# Patient Record
Sex: Female | Born: 1970 | Race: White | Hispanic: No | Marital: Married | Smoking: Former smoker
Health system: Southern US, Community
[De-identification: ages and names within clinical notes are randomized; demographics above are authoritative.]

## PROBLEM LIST (undated history)

## (undated) DIAGNOSIS — F329 Major depressive disorder, single episode, unspecified: Secondary | ICD-10-CM

## (undated) DIAGNOSIS — E039 Hypothyroidism, unspecified: Secondary | ICD-10-CM

## (undated) DIAGNOSIS — E079 Disorder of thyroid, unspecified: Secondary | ICD-10-CM

## (undated) DIAGNOSIS — B019 Varicella without complication: Secondary | ICD-10-CM

## (undated) DIAGNOSIS — E538 Deficiency of other specified B group vitamins: Secondary | ICD-10-CM

## (undated) DIAGNOSIS — F32A Depression, unspecified: Secondary | ICD-10-CM

## (undated) DIAGNOSIS — E559 Vitamin D deficiency, unspecified: Secondary | ICD-10-CM

## (undated) DIAGNOSIS — K219 Gastro-esophageal reflux disease without esophagitis: Secondary | ICD-10-CM

## (undated) DIAGNOSIS — R609 Edema, unspecified: Secondary | ICD-10-CM

## (undated) DIAGNOSIS — F419 Anxiety disorder, unspecified: Secondary | ICD-10-CM

## (undated) DIAGNOSIS — Z9889 Other specified postprocedural states: Secondary | ICD-10-CM

## (undated) DIAGNOSIS — M549 Dorsalgia, unspecified: Secondary | ICD-10-CM

## (undated) DIAGNOSIS — R112 Nausea with vomiting, unspecified: Secondary | ICD-10-CM

## (undated) HISTORY — DX: Disorder of thyroid, unspecified: E07.9

## (undated) HISTORY — DX: Deficiency of other specified B group vitamins: E53.8

## (undated) HISTORY — DX: Anxiety disorder, unspecified: F41.9

## (undated) HISTORY — DX: Nausea with vomiting, unspecified: R11.2

## (undated) HISTORY — DX: Depression, unspecified: F32.A

## (undated) HISTORY — PX: OTHER SURGICAL HISTORY: SHX169

## (undated) HISTORY — DX: Other specified postprocedural states: Z98.890

## (undated) HISTORY — DX: Vitamin D deficiency, unspecified: E55.9

## (undated) HISTORY — DX: Hypothyroidism, unspecified: E03.9

## (undated) HISTORY — DX: Major depressive disorder, single episode, unspecified: F32.9

## (undated) HISTORY — DX: Varicella without complication: B01.9

## (undated) HISTORY — DX: Dorsalgia, unspecified: M54.9

## (undated) HISTORY — DX: Gastro-esophageal reflux disease without esophagitis: K21.9

## (undated) HISTORY — DX: Edema, unspecified: R60.9

---
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
## 1997-09-06 HISTORY — PX: OTHER SURGICAL HISTORY: SHX169

## 2003-10-02 ENCOUNTER — Other Ambulatory Visit: Admission: RE | Admit: 2003-10-02 | Discharge: 2003-10-02 | Payer: Self-pay | Admitting: Obstetrics and Gynecology

## 2004-02-10 ENCOUNTER — Other Ambulatory Visit: Admission: RE | Admit: 2004-02-10 | Discharge: 2004-02-10 | Payer: Self-pay | Admitting: Obstetrics and Gynecology

## 2004-07-01 ENCOUNTER — Other Ambulatory Visit: Admission: RE | Admit: 2004-07-01 | Discharge: 2004-07-01 | Payer: Self-pay | Admitting: Obstetrics and Gynecology

## 2004-10-12 ENCOUNTER — Other Ambulatory Visit: Admission: RE | Admit: 2004-10-12 | Discharge: 2004-10-12 | Payer: Self-pay | Admitting: Obstetrics and Gynecology

## 2005-05-14 ENCOUNTER — Other Ambulatory Visit: Admission: RE | Admit: 2005-05-14 | Discharge: 2005-05-14 | Payer: Self-pay | Admitting: Obstetrics and Gynecology

## 2005-12-16 ENCOUNTER — Other Ambulatory Visit: Admission: RE | Admit: 2005-12-16 | Discharge: 2005-12-16 | Payer: Self-pay | Admitting: Obstetrics and Gynecology

## 2006-09-28 ENCOUNTER — Ambulatory Visit (HOSPITAL_COMMUNITY): Admission: RE | Admit: 2006-09-28 | Discharge: 2006-09-28 | Payer: Self-pay | Admitting: Obstetrics and Gynecology

## 2006-11-28 ENCOUNTER — Inpatient Hospital Stay (HOSPITAL_COMMUNITY): Admission: AD | Admit: 2006-11-28 | Discharge: 2006-11-28 | Payer: Self-pay | Admitting: Obstetrics and Gynecology

## 2006-11-29 ENCOUNTER — Inpatient Hospital Stay (HOSPITAL_COMMUNITY): Admission: AD | Admit: 2006-11-29 | Discharge: 2006-12-02 | Payer: Self-pay | Admitting: Obstetrics & Gynecology

## 2013-01-23 ENCOUNTER — Other Ambulatory Visit: Payer: Self-pay | Admitting: Obstetrics and Gynecology

## 2013-01-23 DIAGNOSIS — R928 Other abnormal and inconclusive findings on diagnostic imaging of breast: Secondary | ICD-10-CM

## 2013-02-02 ENCOUNTER — Ambulatory Visit
Admission: RE | Admit: 2013-02-02 | Discharge: 2013-02-02 | Disposition: A | Discharge status: Home / Self Care | Payer: 59 | Source: Ambulatory Visit | Attending: Obstetrics and Gynecology | Admitting: Obstetrics and Gynecology

## 2013-02-02 DIAGNOSIS — R928 Other abnormal and inconclusive findings on diagnostic imaging of breast: Secondary | ICD-10-CM

## 2015-09-29 ENCOUNTER — Ambulatory Visit (INDEPENDENT_AMBULATORY_CARE_PROVIDER_SITE_OTHER): Payer: 59

## 2015-09-29 ENCOUNTER — Ambulatory Visit (INDEPENDENT_AMBULATORY_CARE_PROVIDER_SITE_OTHER): Payer: 59 | Admitting: Physician Assistant

## 2015-09-29 VITALS — BP 124/86 | HR 79 | Temp 98.9°F | Resp 16 | Ht 65.0 in | Wt 179.0 lb

## 2015-09-29 DIAGNOSIS — S60221A Contusion of right hand, initial encounter: Secondary | ICD-10-CM | POA: Diagnosis not present

## 2015-09-29 DIAGNOSIS — S6991XA Unspecified injury of right wrist, hand and finger(s), initial encounter: Secondary | ICD-10-CM

## 2015-09-29 NOTE — Patient Instructions (Addendum)
Because you received an x-ray today, you will receive an invoice from [facility]. Please contact [facility] at [phone] with questions or concerns regarding your invoice. Our billing staff will not be able to assist you with those questions.   Rest, ice, compression, elevation. Ibuprofen 400-600 mg three times a day. If your symptoms are not improving in 1 week, return.   RICE for Routine Care of Injuries Theroutine careofmanyinjuriesincludes rest, ice, compression, and elevation (RICE therapy). RICE therapy is often recommended for injuries to soft tissues, such as a muscle strain, ligament injuries, bruises, and overuse injuries. It can also be used for some bony injuries. Using RICE therapy can help to relieve pain, lessen swelling, and enable your body to heal. Rest Rest is required to allow your body to heal. This usually involves reducing your normal activities and avoiding use of the injured part of your body. Generally, you can return to your normal activities when you are comfortable and have been given permission by your health care provider. Ice Icing your injury helps to keep the swelling down, and it lessens pain. Do not apply ice directly to your skin.  Put ice in a plastic bag.  Place a towel between your skin and the bag.  Leave the ice on for 20 minutes, 2-3 times a day. Do this for as long as you are directed by your health care provider. Compression Compression means putting pressure on the injured area. Compression helps to keep swelling down, gives support, and helps with discomfort. Compression may be done with an elastic bandage. If an elastic bandage has been applied, follow these general tips:  Remove and reapply the bandage every 3-4 hours or as directed by your health care provider.  Make sure the bandage is not wrapped too tightly, because this can cut off circulation. If part of your body beyond the bandage becomes blue, numb, cold,  swollen, or more painful, your bandage is most likely too tight. If this occurs, remove your bandage and reapply it more loosely.  See your health care provider if the bandage seems to be making your problems worse rather than better. Elevation Elevation means keeping the injured area raised. This helps to lessen swelling and decrease pain. If possible, your injured area should be elevated at or above the level of your heart or the center of your chest. WHEN SHOULD I SEEK MEDICAL CARE? You should seek medical care if:  Your pain and swelling continue.  Your symptoms are getting worse rather than improving. These symptoms may indicate that further evaluation or further X-rays are needed. Sometimes, X-rays may not show a small broken bone (fracture) until a number of days later. Make a follow-up appointment with your health care provider. WHEN SHOULD I SEEK IMMEDIATE MEDICAL CARE? You should seek immediate medical care if:  You have sudden severe pain at or below the area of your injury.  You have redness or increased swelling around your injury.  You have tingling or numbness at or below the area of your injury that does not improve after you remove the elastic bandage.   This information is not intended to replace advice given to you by your health care provider. Make sure you discuss any questions you have with your health care provider.   Document Released: 12/05/2000 Document Revised: 05/14/2015 Document Reviewed: 07/31/2014 Elsevier Interactive Patient Education [employer]

## 2015-09-29 NOTE — Progress Notes (Signed)
[facility] [address], [place] [address] [id number]  Date:  09/29/2015   Name:  Morgan Mejia   DOB:  1971-02-15   MRN:  [id number]  PCP:  No primary care provider on file.    Chief Complaint: Hand Injury   History of Present Illness:  This is a 45 y.o. left hand dominant female who is presenting with right hand swelling x 2 days. States she slipped on wet pavement and fell forward. She landed on her right ulnar side forearm and hand. She has some abrasions on hand and forearm. No pain in forearm. She continues to have swelling and pain in hand and she wants to make sure nothing is broken. She denies paresthesias or weakness. She has taken ibuprofen without much relief.  Has mirena IUD for contraception.  Review of Systems:  Review of Systems See HPI  There are no active problems to display for this patient.   Prior to Admission medications   Medication Sig Start Date End Date Taking? Authorizing Provider  levothyroxine (SYNTHROID, LEVOTHROID) 100 MCG tablet Take 100 mcg by mouth daily before breakfast.   Yes Historical Provider, MD    Allergies  Allergen Reactions  . Aspirin     History reviewed. No pertinent past surgical history.  Social History  Substance Use Topics  . Smoking status: Never Smoker   . Smokeless tobacco: None  . Alcohol Use: None    Family History  Problem Relation Age of Onset  . Diabetes Mother   . Hyperlipidemia Mother   . Cancer Father     Medication list has been reviewed and updated.  Physical Examination:  Physical Exam  Constitutional: She is oriented to person, place, and time. She appears well-developed and well-nourished. No distress.  HENT:  Head: Normocephalic and atraumatic.  Right Ear: Hearing normal.  Left Ear: Hearing normal.  Nose: Nose normal.  Eyes: Conjunctivae and lids are normal. Right eye exhibits no discharge. Left eye exhibits no discharge. No scleral icterus.   Cardiovascular: Normal rate, regular rhythm, normal heart sounds and normal pulses.   No murmur heard. Pulmonary/Chest: Effort normal. No respiratory distress.  Musculoskeletal: Normal range of motion.       Right elbow: Normal.She exhibits normal range of motion and no swelling.       Right forearm: She exhibits no tenderness and no swelling.       Right hand: She exhibits tenderness, bony tenderness (4th and 5th metacarpals) and swelling (mild over lateral hand). She exhibits normal range of motion, normal capillary refill and no laceration. Normal sensation noted. Decreased strength (weak hand grasp, 4/5. Weak thumb and pinky finger opposition, 4/5) noted.       Left hand: Normal.  Abrasion proximal ulnar side forearm. No tenderness Several abrasions over dorsal right hand  Neurological: She is alert and oriented to person, place, and time.  Skin: Skin is warm, dry and intact.  Psychiatric: She has a normal mood and affect. Her speech is normal and behavior is normal. Thought content normal.   BP 124/86 mmHg  Pulse 79  Temp(Src) 98.9 F (37.2 C)  Resp 16  Ht  (1.651 m)  Wt 179 lb (81.194 kg)  BMI 29.79 kg/m2  SpO2 98%  LMP 08/16/2015  Right Hand Radiograph IMPRESSION: Negative.  Assessment and Plan:  1. Hand contusion, right, initial encounter 2. Hand injury, right, initial encounter Radiograph negative. Treat as hand contusion. Counseled on RICE therapy and anti-inflammatories. Return in  1 week if symptoms not improving. - DG Hand Complete Right; Future   [place] [employer]. Dyke Brackett, MHS [facility]  09/29/2015

## 2016-12-23 DIAGNOSIS — E039 Hypothyroidism, unspecified: Secondary | ICD-10-CM | POA: Diagnosis not present

## 2016-12-30 DIAGNOSIS — E039 Hypothyroidism, unspecified: Secondary | ICD-10-CM | POA: Diagnosis not present

## 2017-02-07 DIAGNOSIS — Z01419 Encounter for gynecological examination (general) (routine) without abnormal findings: Secondary | ICD-10-CM | POA: Diagnosis not present

## 2017-02-25 DIAGNOSIS — Z1231 Encounter for screening mammogram for malignant neoplasm of breast: Secondary | ICD-10-CM | POA: Diagnosis not present

## 2017-04-07 DIAGNOSIS — E039 Hypothyroidism, unspecified: Secondary | ICD-10-CM | POA: Diagnosis not present

## 2017-06-08 IMAGING — CR DG HAND COMPLETE 3+V*R*
3 series · 3 of 3 positions shown · non-contrast
Comparison: None.

CLINICAL DATA: Right hand injury, pain

EXAM:
RIGHT HAND - COMPLETE 3+ VIEW

[PA]
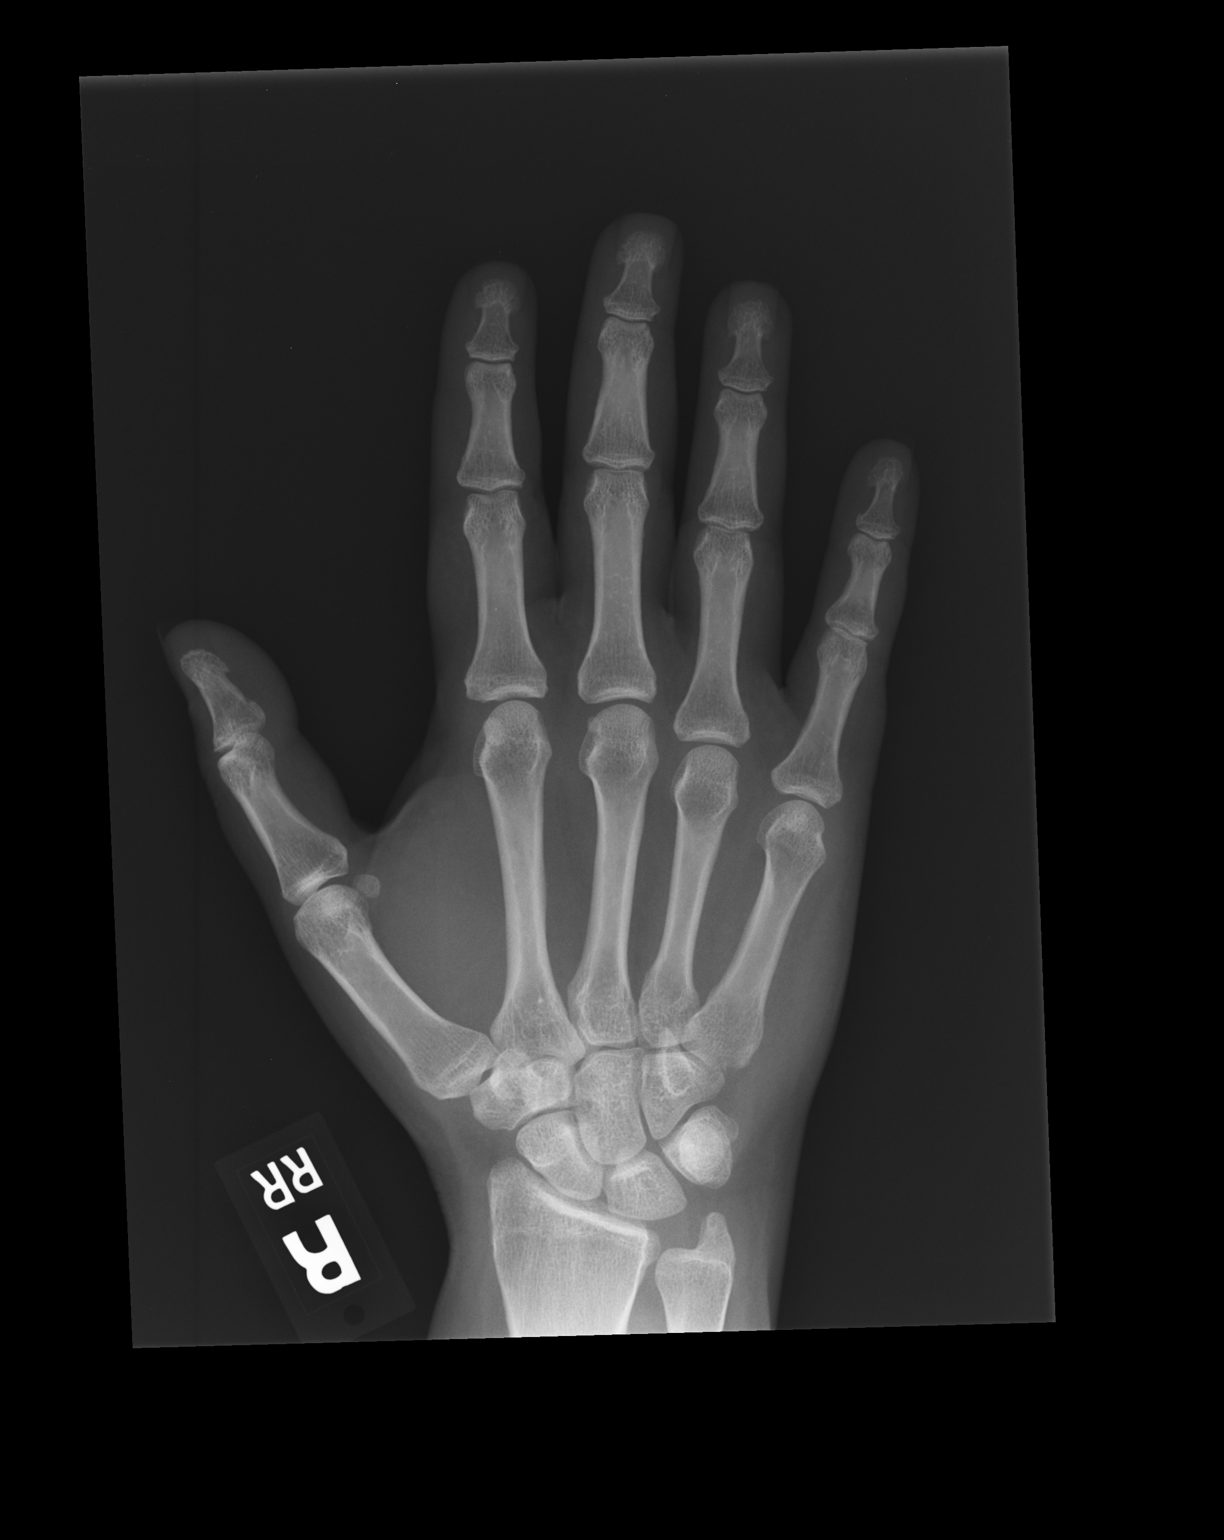

[lateral]
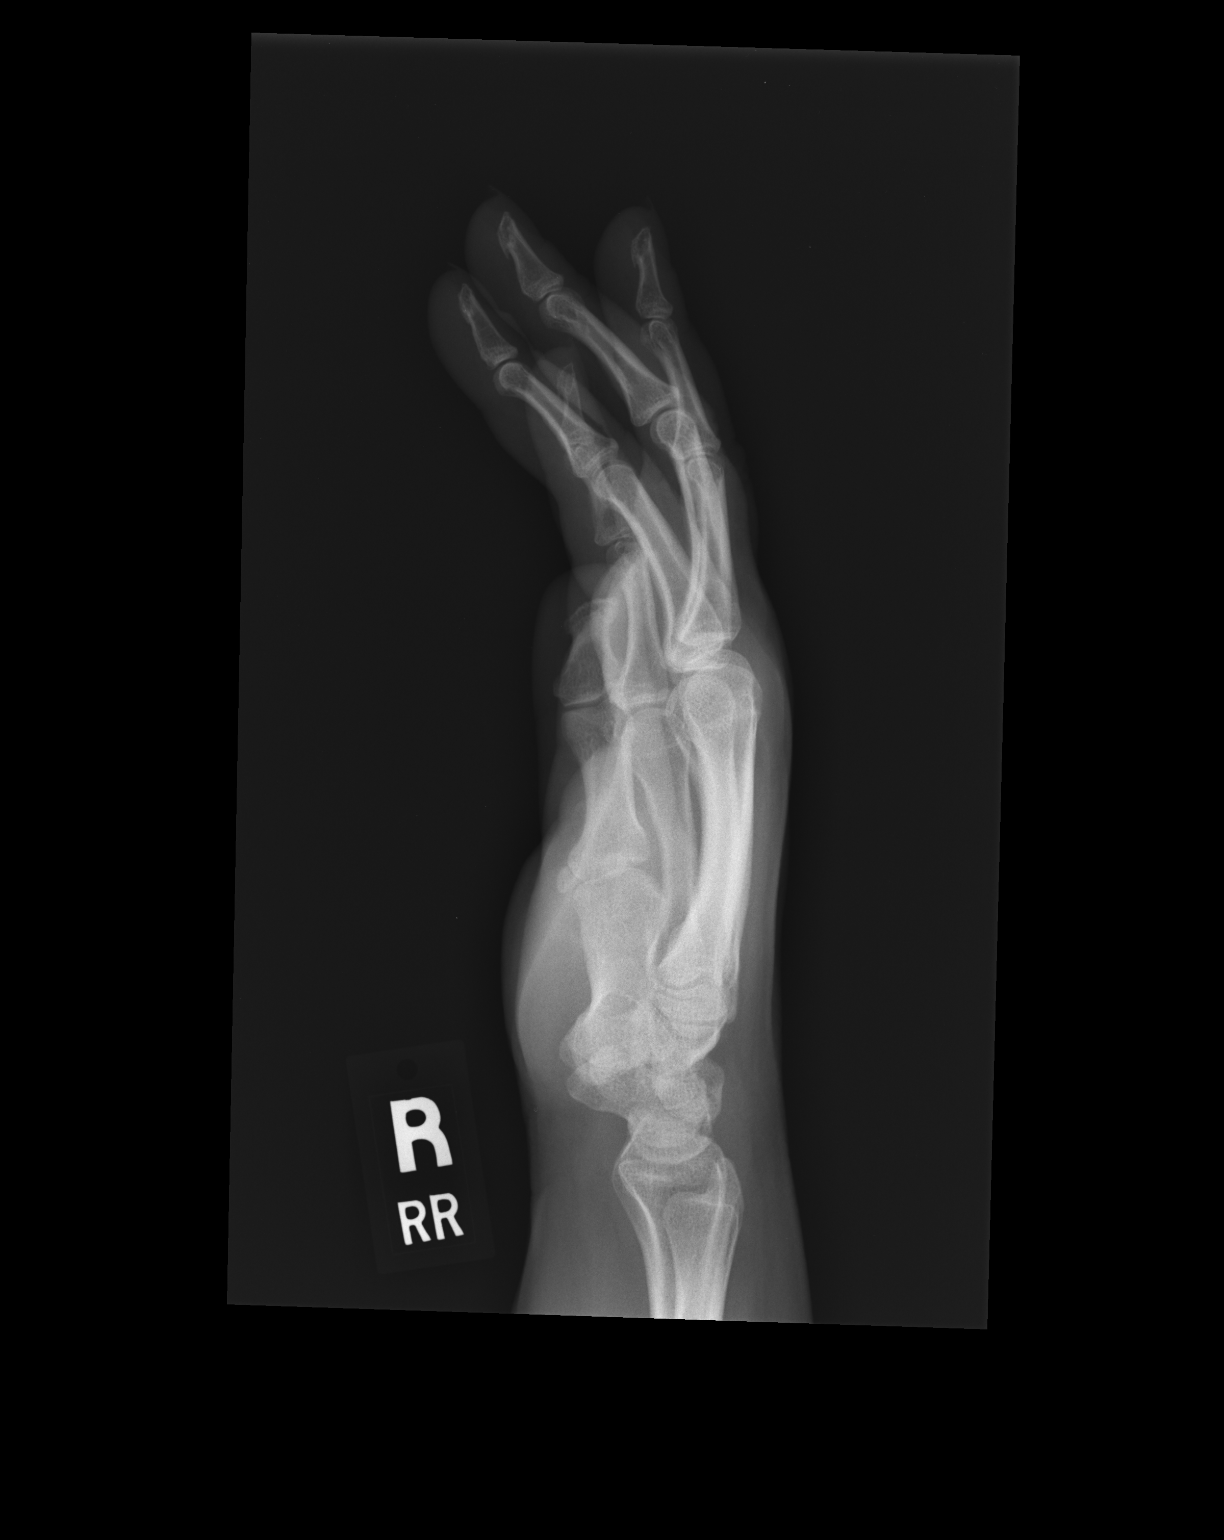

[pa obl]
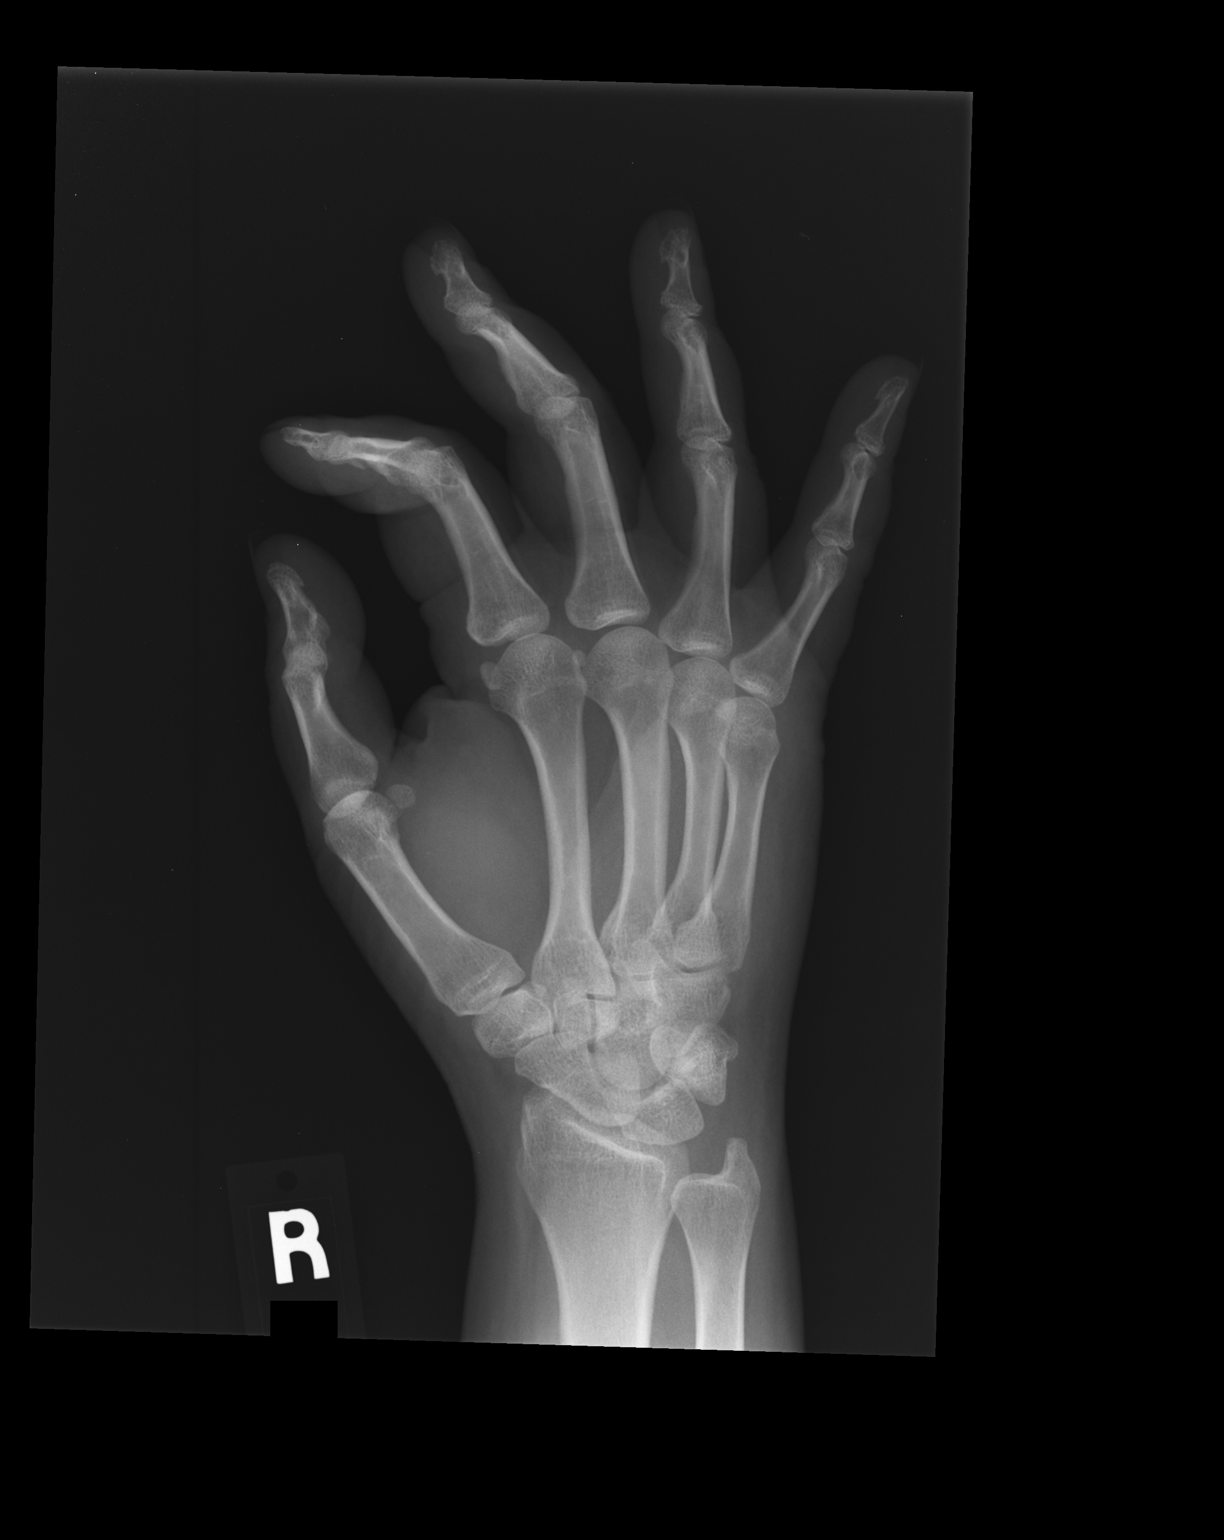

[3 of 3 positions shown; findings below may reference images not displayed]

FINDINGS: There is no evidence of fracture or dislocation. There is no
evidence of arthropathy or other focal bone abnormality. Soft
tissues are unremarkable.
IMPRESSION: Negative.

## 2017-06-24 DIAGNOSIS — E039 Hypothyroidism, unspecified: Secondary | ICD-10-CM | POA: Diagnosis not present

## 2017-12-29 DIAGNOSIS — E039 Hypothyroidism, unspecified: Secondary | ICD-10-CM | POA: Diagnosis not present

## 2018-01-03 DIAGNOSIS — E039 Hypothyroidism, unspecified: Secondary | ICD-10-CM | POA: Diagnosis not present

## 2018-03-16 ENCOUNTER — Telehealth: Payer: Self-pay | Admitting: Internal Medicine

## 2018-03-16 NOTE — Telephone Encounter (Signed)
Dr. Posey ReaPlotnikov,  Would you be willing to see this patient to establish care?

## 2018-03-16 NOTE — Telephone Encounter (Signed)
Copied from CRM [id number]. Topic: Appointment Scheduling - New Patient >> Mar 15, 2018 11:52 AM Alexander BergeronBarksdale, Morgan Mejia: Pt is asking if Plotnikov could take her as a new pt, contact to advise

## 2018-03-23 NOTE — Telephone Encounter (Signed)
Ok Thx 

## 2018-03-24 NOTE — Telephone Encounter (Signed)
Left message informing patient.  Okay to schedule as an OFFICE VISIT with note stating "New Patient - okay per Dr Posey ReaPlotnikov" And please let us know when this is scheduled so that the visit type can be changed.

## 2018-03-27 NOTE — Telephone Encounter (Signed)
Pt has been sch for 04-14-18

## 2018-03-27 NOTE — Telephone Encounter (Signed)
Can you change visit type to NEW PATIENT please?

## 2018-03-29 ENCOUNTER — Ambulatory Visit: Payer: Self-pay | Admitting: Family Medicine

## 2018-04-11 DIAGNOSIS — E039 Hypothyroidism, unspecified: Secondary | ICD-10-CM | POA: Diagnosis not present

## 2018-04-14 ENCOUNTER — Other Ambulatory Visit (INDEPENDENT_AMBULATORY_CARE_PROVIDER_SITE_OTHER): Payer: 59

## 2018-04-14 ENCOUNTER — Encounter: Payer: Self-pay | Admitting: Internal Medicine

## 2018-04-14 ENCOUNTER — Ambulatory Visit: Payer: 59 | Admitting: Internal Medicine

## 2018-04-14 DIAGNOSIS — F329 Major depressive disorder, single episode, unspecified: Secondary | ICD-10-CM | POA: Diagnosis not present

## 2018-04-14 DIAGNOSIS — F32A Depression, unspecified: Secondary | ICD-10-CM | POA: Insufficient documentation

## 2018-04-14 DIAGNOSIS — E039 Hypothyroidism, unspecified: Secondary | ICD-10-CM | POA: Diagnosis not present

## 2018-04-14 DIAGNOSIS — Z0000 Encounter for general adult medical examination without abnormal findings: Secondary | ICD-10-CM

## 2018-04-14 LAB — HEPATIC FUNCTION PANEL
ALBUMIN: 4.4 g/dL (ref 3.5–5.2)
ALK PHOS: 47 U/L (ref 39–117)
ALT: 13 U/L (ref 0–35)
AST: 17 U/L (ref 0–37)
BILIRUBIN DIRECT: 0.1 mg/dL (ref 0.0–0.3)
TOTAL PROTEIN: 7.5 g/dL (ref 6.0–8.3)
Total Bilirubin: 0.5 mg/dL (ref 0.2–1.2)

## 2018-04-14 LAB — CBC WITH DIFFERENTIAL/PLATELET
BASOS ABS: 0 10*3/uL (ref 0.0–0.1)
Basophils Relative: 0.9 % (ref 0.0–3.0)
EOS PCT: 2.6 % (ref 0.0–5.0)
Eosinophils Absolute: 0.2 10*3/uL (ref 0.0–0.7)
HEMATOCRIT: 40.5 % (ref 36.0–46.0)
HEMOGLOBIN: 14.1 g/dL (ref 12.0–15.0)
LYMPHS PCT: 27.5 % (ref 12.0–46.0)
Lymphs Abs: 1.6 10*3/uL (ref 0.7–4.0)
MCHC: 34.7 g/dL (ref 30.0–36.0)
MCV: 92.3 fl (ref 78.0–100.0)
MONOS PCT: 9.8 % (ref 3.0–12.0)
Monocytes Absolute: 0.6 10*3/uL (ref 0.1–1.0)
NEUTROS PCT: 59.2 % (ref 43.0–77.0)
Neutro Abs: 3.5 10*3/uL (ref 1.4–7.7)
Platelets: 180 10*3/uL (ref 150.0–400.0)
RBC: 4.39 Mil/uL (ref 3.87–5.11)
RDW: 12.6 % (ref 11.5–15.5)
WBC: 5.9 10*3/uL (ref 4.0–10.5)

## 2018-04-14 LAB — BASIC METABOLIC PANEL
BUN: 14 mg/dL (ref 6–23)
CALCIUM: 10.3 mg/dL (ref 8.4–10.5)
CO2: 27 meq/L (ref 19–32)
CREATININE: 0.81 mg/dL (ref 0.40–1.20)
Chloride: 102 mEq/L (ref 96–112)
GFR: 80.68 mL/min (ref >60.00)
Glucose, Bld: 107 mg/dL — ABNORMAL HIGH (ref 70–99)
Potassium: 4.2 mEq/L (ref 3.5–5.1)
SODIUM: 138 meq/L (ref 135–145)

## 2018-04-14 LAB — LIPID PANEL
Cholesterol: 146 mg/dL (ref 0–200)
HDL: 47.7 mg/dL (ref >39.00)
LDL CALC: 79 mg/dL (ref 0–99)
NONHDL: 97.93
Total CHOL/HDL Ratio: 3
Triglycerides: 93 mg/dL (ref 0.0–149.0)
VLDL: 18.6 mg/dL (ref 0.0–40.0)

## 2018-04-14 LAB — URINALYSIS, ROUTINE W REFLEX MICROSCOPIC
Bilirubin Urine: NEGATIVE
KETONES UR: NEGATIVE
Leukocytes, UA: NEGATIVE
NITRITE: NEGATIVE
SPECIFIC GRAVITY, URINE: 1.02 (ref 1.000–1.030)
TOTAL PROTEIN, URINE-UPE24: NEGATIVE
URINE GLUCOSE: NEGATIVE
UROBILINOGEN UA: 0.2 (ref 0.0–1.0)
pH: 6 (ref 5.0–8.0)

## 2018-04-14 LAB — TSH: TSH: 1.49 u[IU]/mL (ref 0.35–4.50)

## 2018-04-14 LAB — VITAMIN D 25 HYDROXY (VIT D DEFICIENCY, FRACTURES): VITD: 31.02 ng/mL (ref 30.00–100.00)

## 2018-04-14 LAB — VITAMIN B12: Vitamin B-12: 209 pg/mL — ABNORMAL LOW (ref 211–911)

## 2018-04-14 MED ORDER — VITAMIN D 1000 UNITS PO TABS: 1000.0000 [IU] | ORAL_TABLET | Freq: Every day | ORAL | 3 refills | Status: AC

## 2018-04-14 MED ORDER — VORTIOXETINE HBR 10 MG PO TABS: 10.0000 mg | ORAL_TABLET | Freq: Every day | ORAL | 5 refills | Status: DC

## 2018-04-14 NOTE — Progress Notes (Signed)
Subjective:  Patient ID: Morgan Mejia, female    DOB: Oct 24, 1970  Age: 47 y.o. MRN: [id number]  CC: No chief complaint on file.   HPI Morgan Mejia presents for a well exam C/o depression - long time C/o wt gain Seeing Dr Talmage Nap - hypothyroidism  Outpatient Medications Prior to Visit  Medication Sig Dispense Refill  . levothyroxine (SYNTHROID, LEVOTHROID) 137 MCG tablet Take 137 mcg by mouth daily before breakfast.    . levothyroxine (SYNTHROID, LEVOTHROID) 100 MCG tablet Take 100 mcg by mouth daily before breakfast.     No facility-administered medications prior to visit.     ROS: Review of Systems  Constitutional: Positive for unexpected weight change. Negative for activity change, appetite change, chills and fatigue.  HENT: Negative for congestion, mouth sores and sinus pressure.   Eyes: Negative for visual disturbance.  Respiratory: Negative for cough and chest tightness.   Gastrointestinal: Negative for abdominal pain and nausea.  Genitourinary: Negative for difficulty urinating, frequency and vaginal pain.  Musculoskeletal: Negative for back pain and gait problem.  Skin: Negative for pallor and rash.  Neurological: Negative for dizziness, tremors, weakness, numbness and headaches.  Psychiatric/Behavioral: Positive for dysphoric mood. Negative for confusion, sleep disturbance and suicidal ideas. The patient is nervous/anxious.     Objective:  BP 120/88 (BP Location: Left Arm, Patient Position: Sitting, Cuff Size: Large)   Pulse 81   Ht 5\' 6"  (1.676 m)   Wt 179 lb (81.2 kg)   SpO2 98%   BMI 28.89 kg/m   BP Readings from Last 3 Encounters:  04/14/18 120/88  09/29/15 124/86    Wt Readings from Last 3 Encounters:  04/14/18 179 lb (81.2 kg)  09/29/15 179 lb (81.2 kg)    Physical Exam  Constitutional: She appears well-developed. No distress.  HENT:  Head: Normocephalic.  Right Ear: External ear normal.  Left Ear: External ear normal.  Nose: Nose  normal.  Mouth/Throat: Oropharynx is clear and moist.  Eyes: Pupils are equal, round, and reactive to light. Conjunctivae are normal. Right eye exhibits no discharge. Left eye exhibits no discharge.  Neck: Normal range of motion. Neck supple. No JVD present. No tracheal deviation present. No thyromegaly present.  Cardiovascular: Normal rate, regular rhythm and normal heart sounds.  Pulmonary/Chest: No stridor. No respiratory distress. She has no wheezes.  Abdominal: Soft. Bowel sounds are normal. She exhibits no distension and no mass. There is no tenderness. There is no rebound and no guarding.  Musculoskeletal: She exhibits no edema or tenderness.  Lymphadenopathy:    She has no cervical adenopathy.  Neurological: She displays normal reflexes. No cranial nerve deficit. She exhibits normal muscle tone. Coordination normal.  Skin: No rash noted. No erythema.  Psychiatric: She has a normal mood and affect. Her behavior is normal. Judgment and thought content normal.    No results found for: WBC, HGB, HCT, PLT, GLUCOSE, CHOL, TRIG, HDL, LDLDIRECT, LDLCALC, ALT, AST, NA, K, CL, CREATININE, BUN, CO2, TSH, PSA, INR, GLUF, HGBA1C, MICROALBUR  US Breast Right  Result Date: 02/02/2013 *RADIOLOGY REPORT* Clinical Data:  47 year old female with abnormal screening mammogram - possible right breast mass. DIGITAL DIAGNOSTIC RIGHT MAMMOGRAM  AND RIGHT BREAST ULTRASOUND: Comparison:  01/17/2013 and prior mammograms dating back to 05/23/2008 Findings: ACR Breast Density Category 3: The breast tissue is heterogeneously dense. Spot compression views of the right breast demonstrate minimal increased density in the lower outer right breast, in the area of the screening study finding.  There is no evidence  of persistent mass, distortion or worrisome calcifications. On physical exam, no palpable abnormalities identified within the lower outer right breast. Ultrasound is performed, showing no evidence of solid or cystic  mass, distortion or abnormal areas of shadowing within the lower outer right breast.  An island of normal fibroglandular tissue is present. IMPRESSION: No suspicious mammographic, palpable or sonographic abnormalities within the lower outer right breast, in the area of the screening study finding. BI-RADS CATEGORY 1:  Negative. RECOMMENDATION: Bilateral screening mammograms in 1 year. I have discussed the findings and recommendations with the patient. Results were also provided in writing at the conclusion of the visit.  If applicable, a reminder letter will be sent to the patient regarding the next appointment. Original Report Authenticated By: Harmon PierJeffrey Hu, M.D.   Mm Digital Diagnostic Unilat R  Result Date: 02/02/2013 *RADIOLOGY REPORT* Clinical Data:  47 year old female with abnormal screening mammogram - possible right breast mass. DIGITAL DIAGNOSTIC RIGHT MAMMOGRAM  AND RIGHT BREAST ULTRASOUND: Comparison:  01/17/2013 and prior mammograms dating back to 05/23/2008 Findings: ACR Breast Density Category 3: The breast tissue is heterogeneously dense. Spot compression views of the right breast demonstrate minimal increased density in the lower outer right breast, in the area of the screening study finding.  There is no evidence of persistent mass, distortion or worrisome calcifications. On physical exam, no palpable abnormalities identified within the lower outer right breast. Ultrasound is performed, showing no evidence of solid or cystic mass, distortion or abnormal areas of shadowing within the lower outer right breast.  An island of normal fibroglandular tissue is present. IMPRESSION: No suspicious mammographic, palpable or sonographic abnormalities within the lower outer right breast, in the area of the screening study finding. BI-RADS CATEGORY 1:  Negative. RECOMMENDATION: Bilateral screening mammograms in 1 year. I have discussed the findings and recommendations with the patient. Results were also  provided in writing at the conclusion of the visit.  If applicable, a reminder letter will be sent to the patient regarding the next appointment. Original Report Authenticated By: Harmon PierJeffrey Hu, M.D.    Assessment & Plan:   There are no diagnoses linked to this encounter.   No orders of the defined types were placed in this encounter.    Follow-up: No follow-ups on file.  Sonda PrimesAlex Pami Wool, MD

## 2018-04-14 NOTE — Assessment & Plan Note (Signed)
Start Trintellix low dose - samples 5 m/d

## 2018-04-14 NOTE — Assessment & Plan Note (Signed)
We discussed age appropriate health related issues, including available/recomended screening tests and vaccinations. We discussed a need for adhering to healthy diet and exercise. Labs were ordered to be later reviewed . All questions were answered.   

## 2018-04-14 NOTE — Assessment & Plan Note (Signed)
Dr Talmage NapBalan On Levothyroxine Labs

## 2018-04-20 ENCOUNTER — Ambulatory Visit: Payer: 59 | Admitting: Internal Medicine

## 2018-04-26 ENCOUNTER — Ambulatory Visit: Payer: 59 | Admitting: Internal Medicine

## 2018-04-26 ENCOUNTER — Encounter: Payer: Self-pay | Admitting: Internal Medicine

## 2018-04-26 VITALS — BP 118/76 | HR 65 | Temp 99.1°F | Ht 66.0 in | Wt 180.0 lb

## 2018-04-26 DIAGNOSIS — E538 Deficiency of other specified B group vitamins: Secondary | ICD-10-CM | POA: Diagnosis not present

## 2018-04-26 DIAGNOSIS — F329 Major depressive disorder, single episode, unspecified: Secondary | ICD-10-CM

## 2018-04-26 DIAGNOSIS — E039 Hypothyroidism, unspecified: Secondary | ICD-10-CM

## 2018-04-26 DIAGNOSIS — R5383 Other fatigue: Secondary | ICD-10-CM | POA: Insufficient documentation

## 2018-04-26 MED ORDER — CYANOCOBALAMIN 1000 MCG/ML IJ SOLN
1000.0000 ug | Freq: Once | INTRAMUSCULAR | Status: AC
  Administered 2018-04-26: 1000 ug via SUBCUTANEOUS

## 2018-04-26 MED ORDER — VITAMIN B-12 1000 MCG SL SUBL: 1.0000 | SUBLINGUAL_TABLET | Freq: Every day | SUBLINGUAL | 3 refills | Status: DC

## 2018-04-26 NOTE — Assessment & Plan Note (Signed)
Replace B12

## 2018-04-26 NOTE — Assessment & Plan Note (Signed)
trintellix po

## 2018-04-26 NOTE — Assessment & Plan Note (Signed)
Levothroid 

## 2018-04-26 NOTE — Assessment & Plan Note (Signed)
Fam h/o pernicious anemia No additional w/upat this point Options to replace Vit B12 were discussed

## 2018-04-26 NOTE — Progress Notes (Signed)
Subjective:  Patient ID: Morgan Mejia, female    DOB: [birth year]  Age: 47 y.o. MRN: [id number]  CC: No chief complaint on file.   HPI Morgan CoffinShannon Fonseca presents for B12 def, fatigue and depression f/u  Outpatient Medications Prior to Visit  Medication Sig Dispense Refill  . cholecalciferol (VITAMIN D) 1000 units tablet Take 1 tablet (1,000 Units total) by mouth daily. 100 tablet 3  . levothyroxine (SYNTHROID, LEVOTHROID) 137 MCG tablet Take 137 mcg by mouth daily before breakfast.    . vortioxetine HBr (TRINTELLIX) 10 MG TABS tablet Take 1 tablet (10 mg total) by mouth daily. 30 tablet 5   No facility-administered medications prior to visit.     ROS: Review of Systems  Constitutional: Positive for fatigue. Negative for activity change, appetite change, chills and unexpected weight change.  HENT: Negative for congestion, mouth sores and sinus pressure.   Eyes: Negative for visual disturbance.  Respiratory: Negative for cough and chest tightness.   Gastrointestinal: Negative for abdominal pain and nausea.  Genitourinary: Negative for difficulty urinating, frequency and vaginal pain.  Musculoskeletal: Negative for back pain and gait problem.  Skin: Negative for pallor and rash.  Neurological: Negative for dizziness, tremors, weakness, numbness and headaches.  Psychiatric/Behavioral: Positive for dysphoric mood. Negative for confusion, sleep disturbance and suicidal ideas.    Objective:  BP 118/76 (BP Location: Left Arm, Patient Position: Sitting, Cuff Size: Large)   Pulse 65   Temp 99.1 F (37.3 C) (Oral)   Ht 5\' 6"  (1.676 m)   Wt 180 lb (81.6 kg)   SpO2 98%   BMI 29.05 kg/m   BP Readings from Last 3 Encounters:  04/26/18 118/76  04/14/18 120/88  09/29/15 124/86    Wt Readings from Last 3 Encounters:  04/26/18 180 lb (81.6 kg)  04/14/18 179 lb (81.2 kg)  09/29/15 179 lb (81.2 kg)    Physical Exam  Constitutional: She appears well-developed. No distress.    HENT:  Head: Normocephalic.  Right Ear: External ear normal.  Left Ear: External ear normal.  Nose: Nose normal.  Mouth/Throat: Oropharynx is clear and moist.  Eyes: Pupils are equal, round, and reactive to light. Conjunctivae are normal. Right eye exhibits no discharge. Left eye exhibits no discharge.  Neck: Normal range of motion. Neck supple. No JVD present. No tracheal deviation present. No thyromegaly present.  Cardiovascular: Normal rate, regular rhythm and normal heart sounds.  Pulmonary/Chest: No stridor. No respiratory distress. She has no wheezes.  Abdominal: Soft. Bowel sounds are normal. She exhibits no distension and no mass. There is no tenderness. There is no rebound and no guarding.  Musculoskeletal: She exhibits no edema or tenderness.  Lymphadenopathy:    She has no cervical adenopathy.  Neurological: She displays normal reflexes. No cranial nerve deficit. She exhibits normal muscle tone. Coordination normal.  Skin: No rash noted. No erythema.  Psychiatric: She has a normal mood and affect. Her behavior is normal. Judgment and thought content normal.    Lab Results  Component Value Date   WBC 5.9 04/14/2018   HGB 14.1 04/14/2018   HCT 40.5 04/14/2018   PLT 180.0 04/14/2018   GLUCOSE 107 (H) 04/14/2018   CHOL 146 04/14/2018   TRIG 93.0 04/14/2018   HDL 47.70 04/14/2018   LDLCALC 79 04/14/2018   ALT 13 04/14/2018   AST 17 04/14/2018   NA 138 04/14/2018   K 4.2 04/14/2018   CL 102 04/14/2018   CREATININE 0.81 04/14/2018   BUN 14 04/14/2018  CO2 27 04/14/2018   TSH 1.49 04/14/2018    [place] Breast Right  Result Date: 02/02/2013 *RADIOLOGY REPORT* Clinical Data:  47 year old female with abnormal screening mammogram - possible right breast mass. DIGITAL DIAGNOSTIC RIGHT MAMMOGRAM  AND RIGHT BREAST ULTRASOUND: Comparison:  01/17/2013 and prior mammograms dating back to 05/23/2008 Findings: ACR Breast Density Category 3: The breast tissue is heterogeneously dense.  Spot compression views of the right breast demonstrate minimal increased density in the lower outer right breast, in the area of the screening study finding.  There is no evidence of persistent mass, distortion or worrisome calcifications. On physical exam, no palpable abnormalities identified within the lower outer right breast. Ultrasound is performed, showing no evidence of solid or cystic mass, distortion or abnormal areas of shadowing within the lower outer right breast.  An island of normal fibroglandular tissue is present. IMPRESSION: No suspicious mammographic, palpable or sonographic abnormalities within the lower outer right breast, in the area of the screening study finding. BI-RADS CATEGORY 1:  Negative. RECOMMENDATION: Bilateral screening mammograms in 1 year. I have discussed the findings and recommendations with the patient. Results were also provided in writing at the conclusion of the visit.  If applicable, a reminder letter will be sent to the patient regarding the next appointment. Original Report Authenticated By: Harmon PierJeffrey Hu, M.D.   Mm Digital Diagnostic Unilat R  Result Date: 02/02/2013 *RADIOLOGY REPORT* Clinical Data:  47 year old female with abnormal screening mammogram - possible right breast mass. DIGITAL DIAGNOSTIC RIGHT MAMMOGRAM  AND RIGHT BREAST ULTRASOUND: Comparison:  01/17/2013 and prior mammograms dating back to 05/23/2008 Findings: ACR Breast Density Category 3: The breast tissue is heterogeneously dense. Spot compression views of the right breast demonstrate minimal increased density in the lower outer right breast, in the area of the screening study finding.  There is no evidence of persistent mass, distortion or worrisome calcifications. On physical exam, no palpable abnormalities identified within the lower outer right breast. Ultrasound is performed, showing no evidence of solid or cystic mass, distortion or abnormal areas of shadowing within the lower outer right breast.   An island of normal fibroglandular tissue is present. IMPRESSION: No suspicious mammographic, palpable or sonographic abnormalities within the lower outer right breast, in the area of the screening study finding. BI-RADS CATEGORY 1:  Negative. RECOMMENDATION: Bilateral screening mammograms in 1 year. I have discussed the findings and recommendations with the patient. Results were also provided in writing at the conclusion of the visit.  If applicable, a reminder letter will be sent to the patient regarding the next appointment. Original Report Authenticated By: Harmon PierJeffrey Hu, M.D.    Assessment & Plan:   There are no diagnoses linked to this encounter.   No orders of the defined types were placed in this encounter.    Follow-up: No follow-ups on file.  Sonda PrimesAlex Gizzelle Lacomb, MD

## 2018-05-04 ENCOUNTER — Ambulatory Visit (INDEPENDENT_AMBULATORY_CARE_PROVIDER_SITE_OTHER): Payer: 59

## 2018-05-04 DIAGNOSIS — E538 Deficiency of other specified B group vitamins: Secondary | ICD-10-CM

## 2018-05-04 MED ORDER — CYANOCOBALAMIN 1000 MCG/ML IJ SOLN
1000.0000 ug | Freq: Once | INTRAMUSCULAR | Status: AC
  Administered 2018-05-04: 1000 ug via INTRAMUSCULAR

## 2018-05-04 NOTE — Progress Notes (Signed)
B12 given in office today.    

## 2018-05-04 NOTE — Progress Notes (Signed)
cyan 

## 2018-05-11 ENCOUNTER — Ambulatory Visit (INDEPENDENT_AMBULATORY_CARE_PROVIDER_SITE_OTHER): Payer: 59

## 2018-05-11 DIAGNOSIS — E538 Deficiency of other specified B group vitamins: Secondary | ICD-10-CM

## 2018-05-11 MED ORDER — CYANOCOBALAMIN 1000 MCG/ML IJ SOLN
1000.0000 ug | Freq: Once | INTRAMUSCULAR | Status: AC
  Administered 2018-05-11: 1000 ug via INTRAMUSCULAR

## 2018-05-11 NOTE — Progress Notes (Signed)
Medical treatment/procedure(s) were performed by non-physician practitioner and as supervising physician I was immediately available for consultation/collaboration. I agree with above. Dany Harten A Rex Oesterle, MD  

## 2018-05-17 NOTE — Progress Notes (Signed)
I personally reviewed the Note/Plan and I agree.  

## 2018-05-19 ENCOUNTER — Ambulatory Visit (INDEPENDENT_AMBULATORY_CARE_PROVIDER_SITE_OTHER): Payer: 59 | Admitting: Emergency Medicine

## 2018-05-19 ENCOUNTER — Telehealth: Payer: Self-pay | Admitting: Emergency Medicine

## 2018-05-19 DIAGNOSIS — E538 Deficiency of other specified B group vitamins: Secondary | ICD-10-CM

## 2018-05-19 MED ORDER — CYANOCOBALAMIN 1000 MCG/ML IJ SOLN
1000.0000 ug | Freq: Once | INTRAMUSCULAR | Status: AC
  Administered 2018-05-19: 1000 ug via INTRAMUSCULAR

## 2018-05-19 NOTE — Telephone Encounter (Signed)
Pt came for nurse visit for 4th weekly B12 injection. Pt would like to know if she is to change to once a month injection or needs blood work before proceeding with any further injections.

## 2018-05-21 NOTE — Telephone Encounter (Signed)
OK to change to q 1 mo. Check B12 before the next shot in 1 mo Thx

## 2018-05-22 NOTE — Telephone Encounter (Signed)
LVM for pt to call back and schedule nurse visit for 1 month

## 2018-05-25 DIAGNOSIS — Z6829 Body mass index (BMI) 29.0-29.9, adult: Secondary | ICD-10-CM | POA: Diagnosis not present

## 2018-05-25 DIAGNOSIS — Z01419 Encounter for gynecological examination (general) (routine) without abnormal findings: Secondary | ICD-10-CM | POA: Diagnosis not present

## 2018-05-25 DIAGNOSIS — Z1212 Encounter for screening for malignant neoplasm of rectum: Secondary | ICD-10-CM | POA: Diagnosis not present

## 2018-06-15 ENCOUNTER — Ambulatory Visit (INDEPENDENT_AMBULATORY_CARE_PROVIDER_SITE_OTHER): Payer: 59 | Admitting: *Deleted

## 2018-06-15 ENCOUNTER — Other Ambulatory Visit (INDEPENDENT_AMBULATORY_CARE_PROVIDER_SITE_OTHER): Payer: 59

## 2018-06-15 DIAGNOSIS — E538 Deficiency of other specified B group vitamins: Secondary | ICD-10-CM

## 2018-06-15 LAB — CBC WITH DIFFERENTIAL/PLATELET
Basophils Absolute: 0 10*3/uL (ref 0.0–0.1)
Basophils Relative: 0.8 % (ref 0.0–3.0)
EOS PCT: 2 % (ref 0.0–5.0)
Eosinophils Absolute: 0.1 10*3/uL (ref 0.0–0.7)
HCT: 38.4 % (ref 36.0–46.0)
HEMOGLOBIN: 12.9 g/dL (ref 12.0–15.0)
Lymphocytes Relative: 34.2 % (ref 12.0–46.0)
Lymphs Abs: 2.1 10*3/uL (ref 0.7–4.0)
MCHC: 33.6 g/dL (ref 30.0–36.0)
MCV: 93.1 fl (ref 78.0–100.0)
MONOS PCT: 8.4 % (ref 3.0–12.0)
Monocytes Absolute: 0.5 10*3/uL (ref 0.1–1.0)
Neutro Abs: 3.4 10*3/uL (ref 1.4–7.7)
Neutrophils Relative %: 54.6 % (ref 43.0–77.0)
Platelets: 185 10*3/uL (ref 150.0–400.0)
RBC: 4.13 Mil/uL (ref 3.87–5.11)
RDW: 12.7 % (ref 11.5–15.5)
WBC: 6.1 10*3/uL (ref 4.0–10.5)

## 2018-06-15 LAB — VITAMIN B12: Vitamin B-12: 890 pg/mL (ref 211–911)

## 2018-06-15 MED ORDER — CYANOCOBALAMIN 1000 MCG/ML IJ SOLN
1000.0000 ug | Freq: Once | INTRAMUSCULAR | Status: AC
  Administered 2018-06-15: 1000 ug via INTRAMUSCULAR

## 2018-07-20 ENCOUNTER — Ambulatory Visit (INDEPENDENT_AMBULATORY_CARE_PROVIDER_SITE_OTHER): Payer: 59 | Admitting: *Deleted

## 2018-07-20 ENCOUNTER — Ambulatory Visit: Payer: 59 | Admitting: Internal Medicine

## 2018-07-20 DIAGNOSIS — E538 Deficiency of other specified B group vitamins: Secondary | ICD-10-CM | POA: Diagnosis not present

## 2018-07-20 MED ORDER — CYANOCOBALAMIN 1000 MCG/ML IJ SOLN
1000.0000 ug | Freq: Once | INTRAMUSCULAR | Status: AC
  Administered 2018-07-20: 1000 ug via INTRAMUSCULAR

## 2018-07-27 ENCOUNTER — Ambulatory Visit: Payer: 59 | Admitting: Internal Medicine

## 2018-08-11 ENCOUNTER — Ambulatory Visit: Payer: 59 | Admitting: Internal Medicine

## 2018-09-12 ENCOUNTER — Other Ambulatory Visit (INDEPENDENT_AMBULATORY_CARE_PROVIDER_SITE_OTHER): Payer: 59

## 2018-09-12 ENCOUNTER — Encounter: Payer: Self-pay | Admitting: Internal Medicine

## 2018-09-12 ENCOUNTER — Ambulatory Visit: Payer: 59 | Admitting: Internal Medicine

## 2018-09-12 VITALS — BP 126/80 | HR 71 | Temp 99.2°F | Ht 66.0 in | Wt 186.0 lb

## 2018-09-12 DIAGNOSIS — R5383 Other fatigue: Secondary | ICD-10-CM

## 2018-09-12 DIAGNOSIS — E538 Deficiency of other specified B group vitamins: Secondary | ICD-10-CM

## 2018-09-12 DIAGNOSIS — E039 Hypothyroidism, unspecified: Secondary | ICD-10-CM

## 2018-09-12 DIAGNOSIS — F329 Major depressive disorder, single episode, unspecified: Secondary | ICD-10-CM

## 2018-09-12 LAB — HEPATIC FUNCTION PANEL
ALK PHOS: 40 U/L (ref 39–117)
ALT: 12 U/L (ref 0–35)
AST: 17 U/L (ref 0–37)
Albumin: 4.4 g/dL (ref 3.5–5.2)
BILIRUBIN DIRECT: 0.1 mg/dL (ref 0.0–0.3)
TOTAL PROTEIN: 7 g/dL (ref 6.0–8.3)
Total Bilirubin: 0.5 mg/dL (ref 0.2–1.2)

## 2018-09-12 LAB — CBC WITH DIFFERENTIAL/PLATELET
Basophils Absolute: 0.1 10*3/uL (ref 0.0–0.1)
Basophils Relative: 1.1 % (ref 0.0–3.0)
Eosinophils Absolute: 0.2 10*3/uL (ref 0.0–0.7)
Eosinophils Relative: 2.6 % (ref 0.0–5.0)
HCT: 39.4 % (ref 36.0–46.0)
Hemoglobin: 13.5 g/dL (ref 12.0–15.0)
Lymphocytes Relative: 30 % (ref 12.0–46.0)
Lymphs Abs: 1.8 10*3/uL (ref 0.7–4.0)
MCHC: 34.3 g/dL (ref 30.0–36.0)
MCV: 92.6 fl (ref 78.0–100.0)
Monocytes Absolute: 0.6 10*3/uL (ref 0.1–1.0)
Monocytes Relative: 10.6 % (ref 3.0–12.0)
NEUTROS ABS: 3.3 10*3/uL (ref 1.4–7.7)
Neutrophils Relative %: 55.7 % (ref 43.0–77.0)
PLATELETS: 175 10*3/uL (ref 150.0–400.0)
RBC: 4.26 Mil/uL (ref 3.87–5.11)
RDW: 12.9 % (ref 11.5–15.5)
WBC: 5.9 10*3/uL (ref 4.0–10.5)

## 2018-09-12 LAB — BASIC METABOLIC PANEL
BUN: 10 mg/dL (ref 6–23)
CHLORIDE: 104 meq/L (ref 96–112)
CO2: 26 mEq/L (ref 19–32)
CREATININE: 0.76 mg/dL (ref 0.40–1.20)
Calcium: 9.5 mg/dL (ref 8.4–10.5)
GFR: 86.68 mL/min (ref >60.00)
Glucose, Bld: 105 mg/dL — ABNORMAL HIGH (ref 70–99)
Potassium: 4.2 mEq/L (ref 3.5–5.1)
Sodium: 136 mEq/L (ref 135–145)

## 2018-09-12 LAB — VITAMIN B12: Vitamin B-12: 705 pg/mL (ref 211–911)

## 2018-09-12 LAB — TSH: TSH: 2.4 u[IU]/mL (ref 0.35–4.50)

## 2018-09-12 NOTE — Assessment & Plan Note (Signed)
On B12 

## 2018-09-12 NOTE — Assessment & Plan Note (Signed)
Better  

## 2018-09-12 NOTE — Assessment & Plan Note (Signed)
On Levothyroxine 

## 2018-09-12 NOTE — Progress Notes (Signed)
Subjective:  Patient ID: Morgan Mejia, female    DOB: 02-24-1971  Age: 48 y.o. MRN: [id number]  CC: No chief complaint on file.   HPI Morgan CoffinShannon Poser presents for B12 def - on po tabs only  Outpatient Medications Prior to Visit  Medication Sig Dispense Refill  . cholecalciferol (VITAMIN D) 1000 units tablet Take 1 tablet (1,000 Units total) by mouth daily. 100 tablet 3  . Cyanocobalamin (VITAMIN B-12) 1000 MCG SUBL Place 1 tablet (1,000 mcg total) under the tongue daily. 100 tablet 3  . levothyroxine (SYNTHROID, LEVOTHROID) 137 MCG tablet Take 137 mcg by mouth daily before breakfast.    . vortioxetine HBr (TRINTELLIX) 10 MG TABS tablet Take 1 tablet (10 mg total) by mouth daily. 30 tablet 5   No facility-administered medications prior to visit.     ROS: Review of Systems  Constitutional: Negative for activity change, appetite change, chills, fatigue and unexpected weight change.  HENT: Negative for congestion, mouth sores and sinus pressure.   Eyes: Negative for visual disturbance.  Respiratory: Negative for cough and chest tightness.   Gastrointestinal: Negative for abdominal pain and nausea.  Genitourinary: Negative for difficulty urinating, frequency and vaginal pain.  Musculoskeletal: Negative for back pain and gait problem.  Skin: Negative for pallor and rash.  Neurological: Negative for dizziness, tremors, weakness, numbness and headaches.  Psychiatric/Behavioral: Negative for confusion, sleep disturbance and suicidal ideas.    Objective:  BP 126/80 (BP Location: Left Arm, Patient Position: Sitting, Cuff Size: Large)   Pulse 71   Temp 99.2 F (37.3 C) (Oral)   Ht 5\' 6"  (1.676 m)   Wt 186 lb (84.4 kg)   SpO2 97%   BMI 30.02 kg/m   BP Readings from Last 3 Encounters:  09/12/18 126/80  04/26/18 118/76  04/14/18 120/88    Wt Readings from Last 3 Encounters:  09/12/18 186 lb (84.4 kg)  04/26/18 180 lb (81.6 kg)  04/14/18 179 lb (81.2 kg)    Physical  Exam Constitutional:      General: She is not in acute distress.    Appearance: She is well-developed.  HENT:     Head: Normocephalic.     Right Ear: External ear normal.     Left Ear: External ear normal.     Nose: Nose normal.  Eyes:     General:        Right eye: No discharge.        Left eye: No discharge.     Conjunctiva/sclera: Conjunctivae normal.     Pupils: Pupils are equal, round, and reactive to light.  Neck:     Musculoskeletal: Normal range of motion and neck supple.     Thyroid: No thyromegaly.     Vascular: No JVD.     Trachea: No tracheal deviation.  Cardiovascular:     Rate and Rhythm: Normal rate and regular rhythm.     Heart sounds: Normal heart sounds.  Pulmonary:     Effort: No respiratory distress.     Breath sounds: No stridor. No wheezing.  Abdominal:     General: Bowel sounds are normal. There is no distension.     Palpations: Abdomen is soft. There is no mass.     Tenderness: There is no abdominal tenderness. There is no guarding or rebound.  Musculoskeletal:        General: No tenderness.  Lymphadenopathy:     Cervical: No cervical adenopathy.  Skin:    Findings: No erythema or rash.  Neurological:     Cranial Nerves: No cranial nerve deficit.     Motor: No abnormal muscle tone.     Coordination: Coordination normal.     Deep Tendon Reflexes: Reflexes normal.  Psychiatric:        Behavior: Behavior normal.        Thought Content: Thought content normal.        Judgment: Judgment normal.     Lab Results  Component Value Date   WBC 6.1 06/15/2018   HGB 12.9 06/15/2018   HCT 38.4 06/15/2018   PLT 185.0 06/15/2018   GLUCOSE 107 (H) 04/14/2018   CHOL 146 04/14/2018   TRIG 93.0 04/14/2018   HDL 47.70 04/14/2018   LDLCALC 79 04/14/2018   ALT 13 04/14/2018   AST 17 04/14/2018   NA 138 04/14/2018   K 4.2 04/14/2018   CL 102 04/14/2018   CREATININE 0.81 04/14/2018   BUN 14 04/14/2018   CO2 27 04/14/2018   TSH 1.49 04/14/2018    US  Breast Right  Result Date: 02/02/2013 *RADIOLOGY REPORT* Clinical Data:  48 year old female with abnormal screening mammogram - possible right breast mass. DIGITAL DIAGNOSTIC RIGHT MAMMOGRAM  AND RIGHT BREAST ULTRASOUND: Comparison:  01/17/2013 and prior mammograms dating back to 05/23/2008 Findings: ACR Breast Density Category 3: The breast tissue is heterogeneously dense. Spot compression views of the right breast demonstrate minimal increased density in the lower outer right breast, in the area of the screening study finding.  There is no evidence of persistent mass, distortion or worrisome calcifications. On physical exam, no palpable abnormalities identified within the lower outer right breast. Ultrasound is performed, showing no evidence of solid or cystic mass, distortion or abnormal areas of shadowing within the lower outer right breast.  An island of normal fibroglandular tissue is present. IMPRESSION: No suspicious mammographic, palpable or sonographic abnormalities within the lower outer right breast, in the area of the screening study finding. BI-RADS CATEGORY 1:  Negative. RECOMMENDATION: Bilateral screening mammograms in 1 year. I have discussed the findings and recommendations with the patient. Results were also provided in writing at the conclusion of the visit.  If applicable, a reminder letter will be sent to the patient regarding the next appointment. Original Report Authenticated By: Harmon Pier, M.D.   Mm Digital Diagnostic Unilat R  Result Date: 02/02/2013 *RADIOLOGY REPORT* Clinical Data:  48 year old female with abnormal screening mammogram - possible right breast mass. DIGITAL DIAGNOSTIC RIGHT MAMMOGRAM  AND RIGHT BREAST ULTRASOUND: Comparison:  01/17/2013 and prior mammograms dating back to 05/23/2008 Findings: ACR Breast Density Category 3: The breast tissue is heterogeneously dense. Spot compression views of the right breast demonstrate minimal increased density in the lower outer  right breast, in the area of the screening study finding.  There is no evidence of persistent mass, distortion or worrisome calcifications. On physical exam, no palpable abnormalities identified within the lower outer right breast. Ultrasound is performed, showing no evidence of solid or cystic mass, distortion or abnormal areas of shadowing within the lower outer right breast.  An island of normal fibroglandular tissue is present. IMPRESSION: No suspicious mammographic, palpable or sonographic abnormalities within the lower outer right breast, in the area of the screening study finding. BI-RADS CATEGORY 1:  Negative. RECOMMENDATION: Bilateral screening mammograms in 1 year. I have discussed the findings and recommendations with the patient. Results were also provided in writing at the conclusion of the visit.  If applicable, a reminder letter will be sent to the patient regarding  the next appointment. Original Report Authenticated By: Harmon Pier, M.D.    Assessment & Plan:   There are no diagnoses linked to this encounter.   No orders of the defined types were placed in this encounter.    Follow-up: No follow-ups on file.  Sonda Primes, MD

## 2018-09-12 NOTE — Assessment & Plan Note (Signed)
On [employer] 

## 2018-10-05 ENCOUNTER — Encounter (INDEPENDENT_AMBULATORY_CARE_PROVIDER_SITE_OTHER): Payer: 59

## 2018-10-17 ENCOUNTER — Encounter (INDEPENDENT_AMBULATORY_CARE_PROVIDER_SITE_OTHER): Payer: Self-pay | Admitting: Family Medicine

## 2018-10-17 ENCOUNTER — Ambulatory Visit (INDEPENDENT_AMBULATORY_CARE_PROVIDER_SITE_OTHER): Payer: 59 | Admitting: Family Medicine

## 2018-10-17 VITALS — BP 129/84 | HR 70 | Temp 97.8°F | Ht 65.0 in | Wt 181.0 lb

## 2018-10-17 DIAGNOSIS — R0602 Shortness of breath: Secondary | ICD-10-CM

## 2018-10-17 DIAGNOSIS — R5383 Other fatigue: Secondary | ICD-10-CM

## 2018-10-17 DIAGNOSIS — E538 Deficiency of other specified B group vitamins: Secondary | ICD-10-CM

## 2018-10-17 DIAGNOSIS — E038 Other specified hypothyroidism: Secondary | ICD-10-CM

## 2018-10-17 DIAGNOSIS — E669 Obesity, unspecified: Secondary | ICD-10-CM

## 2018-10-17 DIAGNOSIS — E559 Vitamin D deficiency, unspecified: Secondary | ICD-10-CM

## 2018-10-17 DIAGNOSIS — Z9189 Other specified personal risk factors, not elsewhere classified: Secondary | ICD-10-CM

## 2018-10-17 DIAGNOSIS — Z1331 Encounter for screening for depression: Secondary | ICD-10-CM | POA: Diagnosis not present

## 2018-10-17 DIAGNOSIS — Z6830 Body mass index (BMI) 30.0-30.9, adult: Secondary | ICD-10-CM

## 2018-10-17 DIAGNOSIS — Z0289 Encounter for other administrative examinations: Secondary | ICD-10-CM

## 2018-10-18 LAB — HEMOGLOBIN A1C
Est. average glucose Bld gHb Est-mCnc: 105 mg/dL
Hgb A1c MFr Bld: 5.3 % (ref 4.8–5.6)

## 2018-10-18 LAB — T4, FREE: Free T4: 1.88 ng/dL — ABNORMAL HIGH (ref 0.82–1.77)

## 2018-10-18 LAB — LIPID PANEL WITH LDL/HDL RATIO
Cholesterol, Total: 162 mg/dL (ref 100–199)
HDL: 52 mg/dL (ref >39)
LDL Calculated: 96 mg/dL (ref 0–99)
LDl/HDL Ratio: 1.8 ratio (ref 0.0–3.2)
Triglycerides: 70 mg/dL (ref 0–149)
VLDL Cholesterol Cal: 14 mg/dL (ref 5–40)

## 2018-10-18 LAB — FOLATE: Folate: 12.9 ng/mL (ref >3.0)

## 2018-10-18 LAB — T3: T3 TOTAL: 98 ng/dL (ref 71–180)

## 2018-10-18 LAB — INSULIN, RANDOM: INSULIN: 8.2 u[IU]/mL (ref 2.6–24.9)

## 2018-10-18 LAB — VITAMIN D 25 HYDROXY (VIT D DEFICIENCY, FRACTURES): Vit D, 25-Hydroxy: 34.7 ng/mL (ref 30.0–100.0)

## 2018-10-18 NOTE — Progress Notes (Signed)
Office: [phone]  /  Fax: [phone]   Dear Dr. Posey Rea,   Thank you for referring Morgan Mejia to our clinic. The following note includes my evaluation and treatment recommendations.  HPI:   Chief Complaint: OBESITY    Morgan Mejia has been referred by Georgina Quint. Plotnikov, MD for consultation regarding her obesity and obesity related comorbidities.    Morgan Mejia (MR# [id number]) is a 48 y.o. female who presents on 10/17/2018 for obesity evaluation and treatment. Current BMI is Body mass index is 30.12 kg/m.Marland Kitchen Morgan Mejia has been struggling with her weight for many years and has been unsuccessful in either losing weight, maintaining weight loss, or reaching her healthy weight goal.     Morgan Mejia attended our information session and states she is currently in the action stage of change and ready to dedicate time achieving and maintaining a healthier weight. Morgan Mejia is interested in becoming our patient and working on intensive lifestyle modifications including (but not limited to) diet, exercise and weight loss.    Morgan Mejia states her family eats meals together she thinks her family will eat healthier with  her she struggles with family and or coworkers weight loss sabotage her desired weight loss is 36 lbs she started gaining weight after 48 years old her heaviest weight ever was 182 lbs she has significant food cravings issues  she snacks frequently in the evenings she skips meals frequently she is frequently drinking liquids with calories she frequently makes poor food choices she has problems with excessive hunger  she frequently eats larger portions than normal  she struggles with emotional eating    Fatigue Morgan Mejia feels her energy is lower than it should be. This has worsened with weight gain and has not worsened recently. Morgan Mejia admits to daytime somnolence and  admits to waking up still tired. Patient is at risk for obstructive sleep apnea. Patent has a history  of symptoms of daytime fatigue. Patient generally gets 6 hours of sleep per night, and states they generally have generally restful sleep. Snoring is present. Apneic episodes are not present. Epworth Sleepiness Score is 7.  Dyspnea on exertion Morgan Mejia notes increasing shortness of breath with exercising and seems to be worsening over time with weight gain. She notes getting out of breath sooner with activity than she used to. This has not gotten worse recently. EKG-Normal sinus rhythm at 73 BPM. Morgan Mejia denies orthopnea.  Hypothyroidism (Hashintos) Morgan Mejia has a diagnosis of hypothyroidism. She is on Synthroid 137 mcg. She sees Dr. Talmage Nap of Endocrinology (next appointment is in April). She denies hot or cold intolerance or palpitations, but does admit to ongoing fatigue.  Vitamin D Deficiency Morgan Mejia has a diagnosis of vitamin D deficiency. She is currently taking OTC Vit D. She notes fatigue and denies nausea, vomiting or muscle weakness.  At risk for osteopenia and osteoporosis Morgan Mejia is at higher risk of osteopenia and osteoporosis due to vitamin D deficiency.   Vitamin B12 Deficiency Morgan Mejia has a diagnosis of B12 insufficiency and notes fatigue. She is on OTC Vitamin B12. Last Vitamin B12 level was done on 09/12/2018. She is not a vegetarian and does not have a previous diagnosis of pernicious anemia. She does not have a history of weight loss surgery.   Depression Screen Morgan Mejia Food and Mood (modified PHQ-9) score was  Depression screen PHQ 2/9 10/17/2018  Decreased Interest 2  Down, Depressed, Hopeless -  PHQ - 2 Score 2  Altered sleeping 1  Tired, decreased energy 3  Change in appetite  3  Feeling bad or failure about yourself  3  Trouble concentrating 1  Moving slowly or fidgety/restless 1  Suicidal thoughts 0  PHQ-9 Score 14  Difficult doing work/chores Somewhat difficult    ASSESSMENT AND PLAN:  Other fatigue - Plan: EKG 12-Lead, Hemoglobin A1c, Insulin, random,  Folate  Shortness of breath on exertion - Plan: Lipid Panel With LDL/HDL Ratio  Other specified hypothyroidism - Plan: T4, free, T3  Vitamin D deficiency - Plan: VITAMIN D 25 Hydroxy (Vit-D Deficiency, Fractures)  B12 nutritional deficiency  Depression screening  At risk for osteoporosis  Class 1 obesity with serious comorbidity and body mass index (BMI) of 30.0 to 30.9 in adult, unspecified obesity type  PLAN:  Fatigue Morgan Mejia that her fatigue may be related to obesity, depression or many other causes. Labs will be ordered, and in the meanwhile Morgan Mejia has Mejia to work on diet, exercise and weight loss to help with fatigue. Proper sleep hygiene was discussed including the need for 7-8 hours of quality sleep each night. A sleep study was not ordered based on symptoms and Epworth score.  Dyspnea on exertion Morgan Mejia's shortness of breath appears to be obesity related and exercise induced. She has Mejia to work on weight loss and gradually increase exercise to treat her exercise induced shortness of breath. If Morgan Mejia follows our instructions and loses weight without improvement of her shortness of breath, we will plan to refer to pulmonology. We will monitor this condition regularly. Morgan Mejia agrees to this plan.  Hypothyroidism (Hashintos) Morgan Mejia of the importance of good thyroid control to help with weight loss efforts. She was also Mejia that supertheraputic thyroid levels are dangerous and will not improve weight loss results.  We will check T3 and T4 today. Morgan Mejia agrees to follow up with our clinic in 2 weeks.  Vitamin D Deficiency Morgan Mejia that low vitamin D levels contributes to fatigue and are associated with obesity, breast, and colon cancer. She will follow up for routine testing of vitamin D, at least 2-3 times per year. She was Mejia of the risk of over-replacement of vitamin D and agrees to not increase her dose unless she  discusses this with us first. We will check Vit D level today. Morgan Mejia agrees to follow up with our clinic in 2 weeks.  At risk for osteopenia and osteoporosis Morgan Mejia was given extended (15 minutes) osteoporosis prevention counseling today. Morgan Mejia is at risk for osteopenia and osteoporsis due to her vitamin D deficiency. She was encouraged to take her vitamin D and follow her higher calcium diet and increase strengthening exercise to help strengthen her bones and decrease her risk of osteopenia and osteoporosis.  Vitamin B12 Deficiency Morgan Mejia will work on increasing B12 rich foods in her diet. B12 supplementation was not prescribed today. We will follow up on labs in 3 months.  Depression Screen Morgan Mejia had a moderately positive depression screening. Depression is commonly associated with obesity and often results in emotional eating behaviors. We will monitor this closely and work on CBT to help improve the non-hunger eating patterns. Referral to Psychology may be required if no improvement is seen as she continues in our clinic.  Obesity Morgan Mejia is currently in the action stage of change and her goal is to continue with weight loss efforts. I recommend Morgan Mejia begin the structured treatment plan as follows:  She has Mejia to follow the Category 2 plan + 100 calories Morgan Mejia has been instructed to eventually work up  to a goal of 150 minutes of combined cardio and strengthening exercise per week for weight loss and overall health benefits. We discussed the following Behavioral Modification Strategies today: increasing lean protein intake, decreasing simple carbohydrates  and work on meal planning and easy cooking plans   She was Mejia of the importance of frequent follow up visits to maximize her success with intensive lifestyle modifications for her multiple health conditions. She was Mejia we would discuss her lab results at her next visit unless there is a critical issue that needs  to be addressed sooner. Morgan Mejia to keep her next visit at the Mejia upon time to discuss these results.  ALLERGIES: Allergies  Allergen Reactions  . Aspirin   . Wellbutrin [Bupropion]     hives    MEDICATIONS: Current Outpatient Medications on File Prior to Visit  Medication Sig Dispense Refill  . cholecalciferol (VITAMIN D) 1000 units tablet Take 1 tablet (1,000 Units total) by mouth daily. 100 tablet 3  . Cyanocobalamin (VITAMIN B-12) 1000 MCG SUBL Place 1 tablet (1,000 mcg total) under the tongue daily. 100 tablet 3  . levothyroxine (SYNTHROID, LEVOTHROID) 137 MCG tablet Take 137 mcg by mouth daily before breakfast.    . vortioxetine HBr (TRINTELLIX) 10 MG TABS tablet Take 1 tablet (10 mg total) by mouth daily. 30 tablet 5   No current facility-administered medications on file prior to visit.     PAST MEDICAL HISTORY: Past Medical History:  Diagnosis Date  . Anxiety   . B12 deficiency   . Back pain   . Chicken pox   . Depression   . GERD (gastroesophageal reflux disease)   . Hypothyroidism   . Swelling   . Thyroid disease   . Vitamin D deficiency     PAST SURGICAL HISTORY: Past Surgical History:  Procedure Laterality Date  . Bone Spur    . CESAREAN SECTION  04/2002  . CESAREAN SECTION  11/2006    SOCIAL HISTORY: Social History   Tobacco Use  . Smoking status: Former Games developer  . Smokeless tobacco: Never Used  Substance Use Topics  . Alcohol use: Yes    Alcohol/week: 0.0 standard drinks  . Drug use: Never    FAMILY HISTORY: Family History  Problem Relation Age of Onset  . Diabetes Mother   . Hyperlipidemia Mother   . Uterine cancer Mother 11  . Pernicious anemia Mother   . Hypertension Mother   . Thyroid disease Mother   . Depression Mother   . Anxiety disorder Mother   . Obesity Mother   . Cancer Father 72       ?leukemia  . Alcoholism Father   . Pernicious anemia Maternal Aunt     ROS: Review of Systems  Constitutional: Positive for  malaise/fatigue. Negative for weight loss.  Eyes:       + Wear glasses or contacts  Respiratory: Positive for shortness of breath (with exertion).   Cardiovascular: Negative for palpitations and orthopnea.  Gastrointestinal: Negative for nausea and vomiting.  Musculoskeletal: Positive for back pain.       Negative muscle weakness  Endo/Heme/Allergies:       Negative hot/cold intolerance  Psychiatric/Behavioral: Positive for depression. Negative for suicidal ideas.    PHYSICAL EXAM: Blood pressure 129/84, pulse 70, temperature 97.8 F (36.6 C), temperature source Oral, height 5\' 5"  (1.651 m), weight 181 lb (82.1 kg), SpO2 99 %. Body mass index is 30.12 kg/m. Physical Exam Vitals signs reviewed.  Constitutional:  Appearance: Normal appearance. She is obese.  HENT:     Head: Normocephalic and atraumatic.     Nose: Nose normal.  Eyes:     General: No scleral icterus.    Extraocular Movements: Extraocular movements intact.  Neck:     Musculoskeletal: Normal range of motion and neck supple.     Comments: No thyromegaly present Cardiovascular:     Rate and Rhythm: Normal rate and regular rhythm.     Pulses: Normal pulses.     Heart sounds: Normal heart sounds.  Pulmonary:     Effort: Pulmonary effort is normal. No respiratory distress.     Breath sounds: Normal breath sounds.  Abdominal:     Palpations: Abdomen is soft.     Tenderness: There is no abdominal tenderness.     Comments: + Obesity  Musculoskeletal: Normal range of motion.     Right lower leg: No edema.     Left lower leg: No edema.  Skin:    General: Skin is warm and dry.  Neurological:     Mental Status: She is alert and oriented to person, place, and time.     Coordination: Coordination normal.  Psychiatric:        Mood and Affect: Mood normal.        Behavior: Behavior normal.     RECENT LABS AND TESTS: BMET    Component Value Date/Time   NA 136 09/12/2018 1051   K 4.2 09/12/2018 1051   CL 104  09/12/2018 1051   CO2 26 09/12/2018 1051   GLUCOSE 105 (H) 09/12/2018 1051   BUN 10 09/12/2018 1051   CREATININE 0.76 09/12/2018 1051   CALCIUM 9.5 09/12/2018 1051   Lab Results  Component Value Date   HGBA1C 5.3 10/17/2018   Lab Results  Component Value Date   INSULIN 8.2 10/17/2018   CBC    Component Value Date/Time   WBC 5.9 09/12/2018 1051   RBC 4.26 09/12/2018 1051   HGB 13.5 09/12/2018 1051   HCT 39.4 09/12/2018 1051   PLT 175.0 09/12/2018 1051   MCV 92.6 09/12/2018 1051   MCHC 34.3 09/12/2018 1051   RDW 12.9 09/12/2018 1051   LYMPHSABS 1.8 09/12/2018 1051   MONOABS 0.6 09/12/2018 1051   EOSABS 0.2 09/12/2018 1051   BASOSABS 0.1 09/12/2018 1051   Iron/TIBC/Ferritin/ %Sat No results found for: IRON, TIBC, FERRITIN, IRONPCTSAT Lipid Panel     Component Value Date/Time   CHOL 162 10/17/2018 1006   TRIG 70 10/17/2018 1006   HDL 52 10/17/2018 1006   CHOLHDL 3 04/14/2018 0919   VLDL 18.6 04/14/2018 0919   LDLCALC 96 10/17/2018 1006   Hepatic Function Panel     Component Value Date/Time   PROT 7.0 09/12/2018 1051   ALBUMIN 4.4 09/12/2018 1051   AST 17 09/12/2018 1051   ALT 12 09/12/2018 1051   ALKPHOS 40 09/12/2018 1051   BILITOT 0.5 09/12/2018 1051   BILIDIR 0.1 09/12/2018 1051      Component Value Date/Time   TSH 2.40 09/12/2018 1051   TSH 1.49 04/14/2018 0919    ECG  shows NSR with a rate of 73 BPM INDIRECT CALORIMETER done today shows a VO2 of 193 and a REE of 1355.  Her calculated basal metabolic rate is 1610 thus her basal metabolic rate is worse than expected.       OBESITY BEHAVIORAL INTERVENTION VISIT  Today's visit was # 1   Starting weight: 181 lbs Starting date: 10/17/2018 Today's weight :  181 lbs  Today's date: 10/17/2018 Total lbs lost to date: 0    ASK: We discussed the diagnosis of obesity with Morgan Mejia today and Morgan Mejia to give us permission to discuss obesity behavioral modification therapy  today.  ASSESS: Morgan Mejia has the diagnosis of obesity and her BMI today is 30.12 Morgan Mejia is in the action stage of change   ADVISE: Morgan Mejia was educated on the multiple health risks of obesity as well as the benefit of weight loss to improve her health. She was advised of the need for long term treatment and the importance of lifestyle modifications to improve her current health and to decrease her risk of future health problems.  AGREE: Multiple dietary modification options and treatment options were discussed and  Morgan Mejia to follow the recommendations documented in the above note.  ARRANGE: Morgan Mejia was educated on the importance of frequent visits to treat obesity as outlined per CMS and USPSTF guidelines and Mejia to schedule her next follow up appointment today.  I, Burt KnackSharon Martin, am acting as transcriptionist for Debbra RidingAlexandria Kadolph, MD   I have reviewed the above documentation for accuracy and completeness, and I agree with the above. - Debbra RidingAlexandria Kadolph, MD

## 2018-10-31 ENCOUNTER — Ambulatory Visit (INDEPENDENT_AMBULATORY_CARE_PROVIDER_SITE_OTHER): Payer: 59 | Admitting: Family Medicine

## 2018-10-31 ENCOUNTER — Encounter (INDEPENDENT_AMBULATORY_CARE_PROVIDER_SITE_OTHER): Payer: Self-pay | Admitting: Family Medicine

## 2018-10-31 VITALS — BP 141/85 | HR 83 | Temp 98.5°F | Ht 65.0 in | Wt 176.0 lb

## 2018-10-31 DIAGNOSIS — E8881 Metabolic syndrome: Secondary | ICD-10-CM | POA: Diagnosis not present

## 2018-10-31 DIAGNOSIS — Z9189 Other specified personal risk factors, not elsewhere classified: Secondary | ICD-10-CM

## 2018-10-31 DIAGNOSIS — Z6830 Body mass index (BMI) 30.0-30.9, adult: Secondary | ICD-10-CM | POA: Diagnosis not present

## 2018-10-31 DIAGNOSIS — E669 Obesity, unspecified: Secondary | ICD-10-CM | POA: Diagnosis not present

## 2018-10-31 DIAGNOSIS — R030 Elevated blood-pressure reading, without diagnosis of hypertension: Secondary | ICD-10-CM

## 2018-10-31 DIAGNOSIS — E559 Vitamin D deficiency, unspecified: Secondary | ICD-10-CM | POA: Diagnosis not present

## 2018-10-31 MED ORDER — VITAMIN D (ERGOCALCIFEROL) 1.25 MG (50000 UNIT) PO CAPS: 50000.0000 [IU] | ORAL_CAPSULE | ORAL | 0 refills | Status: DC

## 2018-11-01 NOTE — Progress Notes (Signed)
Office: [phone]  /  Fax: [phone]   HPI:   Chief Complaint: OBESITY Morgan Mejia is here to discuss her progress with her obesity treatment plan. She is on the Category 2 plan + 100 calories and is following her eating plan approximately 85 % of the time. She states she is exercising 0 minutes 0 times per week. Morgan Mejia reports hunger 1st thing in the morning, 3 hours after breakfast, and 5 and a half hours after lunch. She is doing 8 oz of meat at dinner more often.  Her weight is 176 lb (79.8 kg) today and has had a weight loss of 5 pounds over a period of 2 weeks since her last visit. She has lost 5 lbs since starting treatment with [place].  Vitamin D Deficiency Morgan Mejia has a diagnosis of vitamin D deficiency. She is currently taking OTC Vit D. She notes fatigue and denies nausea, vomiting or muscle weakness.  Insulin Resistance Morgan Mejia has a diagnosis of insulin resistance based on her elevated fasting insulin level >5. Although Morgan Mejia's blood glucose readings are still under good control, insulin resistance puts her at greater risk of metabolic syndrome and diabetes. She is not taking metformin currently and notes cravings and hunger. She continues to work on diet and exercise to decrease risk of diabetes.  At risk for diabetes Morgan Mejia is at higher than average risk for developing diabetes due to her obesity and insulin resistance. She currently denies polyuria or polydipsia.  Elevated Blood Pressure Morgan Mejia's blood pressure is elevated today. She denies history of prior elevations in blood pressure. She denies chest pain, chest pressure, or headaches. She is working on weight loss to help control her blood pressure with the goal of decreasing her risk of heart attack and stroke. Morgan Mejia's blood pressure is not currently controlled.  ASSESSMENT AND PLAN:  Vitamin D deficiency - Plan: Vitamin D, Ergocalciferol, (DRISDOL) 1.25 MG (50000 UT) CAPS capsule  Insulin  resistance  Elevated BP without diagnosis of hypertension  At risk for diabetes mellitus  Class 1 obesity with serious comorbidity and body mass index (BMI) of 30.0 to 30.9 in adult, unspecified obesity type - beginning BMI >30  PLAN:  Vitamin D Deficiency Morgan Mejia was informed that low vitamin D levels contributes to fatigue and are associated with obesity, breast, and colon cancer. Morgan Mejia agrees to start prescription Vit D @50 ,000 IU every week #4 with no refills. She will follow up for routine testing of vitamin D, at least 2-3 times per year. She was informed of the risk of over-replacement of vitamin D and agrees to not increase her dose unless she discusses this with [place] first. Morgan Mejia agrees to follow up with our clinic in 2 weeks.  Insulin Resistance Morgan Mejia will continue to work on weight loss, exercise, and decreasing simple carbohydrates in her diet to help decrease the risk of diabetes. We dicussed metformin including benefits and risks. She was informed that eating too many simple carbohydrates or too many calories at one sitting increases the likelihood of GI side effects. Morgan Mejia declined metformin for now and prescription was not written today. We will retest Hgb A1c and insulin in 3 months. Morgan Mejia agrees to follow up with our clinic in 2 weeks as directed to monitor her progress.  Diabetes risk counseling Morgan Mejia was given extended (30 minutes) diabetes prevention counseling today. She is 48 y.o. female and has risk factors for diabetes including obesity and insulin resistance. We discussed intensive lifestyle modifications today with an emphasis on weight loss  as well as increasing exercise and decreasing simple carbohydrates in her diet.  Elevated Blood Pressure We discussed sodium restriction, working on healthy weight loss, and a regular exercise program as the means to achieve improved blood pressure control. Morgan Mejia agreed with this plan and agreed to follow up as directed.  We will continue to monitor her blood pressure as well as her progress with the above lifestyle modifications. She will watch for signs of hypotension as she continues her lifestyle modifications. We will follow up on blood pressure at next appointment. Morgan Mejia agrees to follow up with our clinic in 2 weeks.  Obesity Morgan Mejia is currently in the action stage of change. As such, her goal is to continue with weight loss efforts She has agreed to keep a food journal with 1300-1450 calories and 80+ grams of protein daily or follow the Category 2 plan + 100 calories Additions lunch options given and [name] is to journal on weekends. Morgan Mejia has been instructed to work up to a goal of 150 minutes of combined cardio and strengthening exercise per week for weight loss and overall health benefits. We discussed the following Behavioral Modification Strategies today: increasing lean protein intake, increasing vegetables,  work on meal planning and easy cooking plans, better snacking choices, and planning for success   Morgan Mejia has agreed to follow up with our clinic in 2 weeks. She was informed of the importance of frequent follow up visits to maximize her success with intensive lifestyle modifications for her multiple health conditions.  ALLERGIES: Allergies  Allergen Reactions  . Aspirin   . Wellbutrin [Bupropion]     hives    MEDICATIONS: Current Outpatient Medications on File Prior to Visit  Medication Sig Dispense Refill  . cholecalciferol (VITAMIN D) 1000 units tablet Take 1 tablet (1,000 Units total) by mouth daily. 100 tablet 3  . Cyanocobalamin (VITAMIN B-12) 1000 MCG SUBL Place 1 tablet (1,000 mcg total) under the tongue daily. 100 tablet 3  . levothyroxine (SYNTHROID, LEVOTHROID) 137 MCG tablet Take 137 mcg by mouth daily before breakfast.    . vortioxetine HBr (TRINTELLIX) 10 MG TABS tablet Take 1 tablet (10 mg total) by mouth daily. 30 tablet 5   No current facility-administered  medications on file prior to visit.     PAST MEDICAL HISTORY: Past Medical History:  Diagnosis Date  . Anxiety   . B12 deficiency   . Back pain   . Chicken pox   . Depression   . GERD (gastroesophageal reflux disease)   . Hypothyroidism   . Swelling   . Thyroid disease   . Vitamin D deficiency     PAST SURGICAL HISTORY: Past Surgical History:  Procedure Laterality Date  . Bone Spur    . CESAREAN SECTION  04/2002  . CESAREAN SECTION  11/2006    SOCIAL HISTORY: Social History   Tobacco Use  . Smoking status: Former Games developer  . Smokeless tobacco: Never Used  Substance Use Topics  . Alcohol use: Yes    Alcohol/week: 0.0 standard drinks  . Drug use: Never    FAMILY HISTORY: Family History  Problem Relation Age of Onset  . Diabetes Mother   . Hyperlipidemia Mother   . Uterine cancer Mother 36  . Pernicious anemia Mother   . Hypertension Mother   . Thyroid disease Mother   . Depression Mother   . Anxiety disorder Mother   . Obesity Mother   . Cancer Father 61       ?leukemia  .  Alcoholism Father   . Pernicious anemia Maternal Aunt     ROS: Review of Systems  Constitutional: Positive for malaise/fatigue and weight loss.  Cardiovascular: Negative for chest pain.       Negative chest pressure  Gastrointestinal: Negative for nausea and vomiting.  Genitourinary: Negative for frequency.  Musculoskeletal:       Negative muscle weakness  Neurological: Negative for headaches.  Endo/Heme/Allergies: Negative for polydipsia.    PHYSICAL EXAM: Blood pressure (!) 141/85, pulse 83, temperature 98.5 F (36.9 C), temperature source Oral, height  (1.651 m), weight 176 lb (79.8 kg), SpO2 98 %. Body mass index is 29.29 kg/m. Physical Exam Vitals signs reviewed.  Constitutional:      Appearance: Normal appearance. She is obese.  Cardiovascular:     Rate and Rhythm: Normal rate.     Pulses: Normal pulses.  Pulmonary:     Effort: Pulmonary effort is normal.      Breath sounds: Normal breath sounds.  Musculoskeletal: Normal range of motion.  Skin:    General: Skin is warm and dry.  Neurological:     Mental Status: She is alert and oriented to person, place, and time.  Psychiatric:        Mood and Affect: Mood normal.        Behavior: Behavior normal.     RECENT LABS AND TESTS: BMET    Component Value Date/Time   NA 136 09/12/2018 1051   K 4.2 09/12/2018 1051   CL 104 09/12/2018 1051   CO2 26 09/12/2018 1051   GLUCOSE 105 (H) 09/12/2018 1051   BUN 10 09/12/2018 1051   CREATININE 0.76 09/12/2018 1051   CALCIUM 9.5 09/12/2018 1051   Lab Results  Component Value Date   HGBA1C 5.3 10/17/2018   Lab Results  Component Value Date   INSULIN 8.2 10/17/2018   CBC    Component Value Date/Time   WBC 5.9 09/12/2018 1051   RBC 4.26 09/12/2018 1051   HGB 13.5 09/12/2018 1051   HCT 39.4 09/12/2018 1051   PLT 175.0 09/12/2018 1051   MCV 92.6 09/12/2018 1051   MCHC 34.3 09/12/2018 1051   RDW 12.9 09/12/2018 1051   LYMPHSABS 1.8 09/12/2018 1051   MONOABS 0.6 09/12/2018 1051   EOSABS 0.2 09/12/2018 1051   BASOSABS 0.1 09/12/2018 1051   Iron/TIBC/Ferritin/ %Sat No results found for: IRON, TIBC, FERRITIN, IRONPCTSAT Lipid Panel     Component Value Date/Time   CHOL 162 10/17/2018 1006   TRIG 70 10/17/2018 1006   HDL 52 10/17/2018 1006   CHOLHDL 3 04/14/2018 0919   VLDL 18.6 04/14/2018 0919   LDLCALC 96 10/17/2018 1006   Hepatic Function Panel     Component Value Date/Time   PROT 7.0 09/12/2018 1051   ALBUMIN 4.4 09/12/2018 1051   AST 17 09/12/2018 1051   ALT 12 09/12/2018 1051   ALKPHOS 40 09/12/2018 1051   BILITOT 0.5 09/12/2018 1051   BILIDIR 0.1 09/12/2018 1051      Component Value Date/Time   TSH 2.40 09/12/2018 1051   TSH 1.49 04/14/2018 0919      OBESITY BEHAVIORAL INTERVENTION VISIT  Today's visit was # 2   Starting weight: 181 lbs Starting date: 10/17/2018 Today's weight : 176 lbs  Today's date:  10/31/2018 Total lbs lost to date: 5    10/31/2018  Height  (1.651 m)  Weight 176 lb (79.8 kg)  BMI (Calculated) 29.29  BLOOD PRESSURE - SYSTOLIC 141  BLOOD PRESSURE - DIASTOLIC  85   Body Fat % 38.1 %  Total Body Water (lbs) 75.4 lbs     ASK: We discussed the diagnosis of obesity with Donnie Coffin today and Morgan Mejia agreed to give [place] permission to discuss obesity behavioral modification therapy today.  ASSESS: Kresta has the diagnosis of obesity and her BMI today is 29.29 Kazandra is in the action stage of change   ADVISE: Chaelyn was educated on the multiple health risks of obesity as well as the benefit of weight loss to improve her health. She was advised of the need for long term treatment and the importance of lifestyle modifications to improve her current health and to decrease her risk of future health problems.  AGREE: Multiple dietary modification options and treatment options were discussed and  Karmah agreed to follow the recommendations documented in the above note.  ARRANGE: Reid was educated on the importance of frequent visits to treat obesity as outlined per CMS and USPSTF guidelines and agreed to schedule her next follow up appointment today.  I, Burt Knack, am acting as transcriptionist for Debbra Riding, MD I have reviewed the above documentation for accuracy and completeness, and I agree with the above. - Debbra Riding, MD

## 2018-11-02 ENCOUNTER — Other Ambulatory Visit: Payer: Self-pay | Admitting: Internal Medicine

## 2018-11-14 ENCOUNTER — Telehealth: Payer: Self-pay | Admitting: Internal Medicine

## 2018-11-14 MED ORDER — OSELTAMIVIR PHOSPHATE 75 MG PO CAPS: 75.0000 mg | ORAL_CAPSULE | Freq: Two times a day (BID) | ORAL | 0 refills | Status: AC

## 2018-11-14 NOTE — Telephone Encounter (Signed)
Done erx 

## 2018-11-14 NOTE — Telephone Encounter (Signed)
Copied from CRM [id number]. Topic: General - Inquiry >> Nov 14, 2018  2:24 PM Maia Petties wrote: Reason for CRM: pt daughter was DX with flu A this morning. Pt has a headache but no fever or other symptoms. Requesting preventative/medication be sent in for her. Please notify pt.  [id number] [place], [place] - [address] AT [facility] [address] [phone] (Phone) [phone] (Fax)

## 2018-11-14 NOTE — Telephone Encounter (Signed)
Please advise in Dr. plotnikovs absence 

## 2018-11-15 NOTE — Telephone Encounter (Signed)
Pt notified RX sent

## 2018-11-15 NOTE — Telephone Encounter (Signed)
Patient informed rx was sent 

## 2018-11-20 ENCOUNTER — Ambulatory Visit (INDEPENDENT_AMBULATORY_CARE_PROVIDER_SITE_OTHER): Payer: 59 | Admitting: Family Medicine

## 2018-11-20 ENCOUNTER — Encounter (INDEPENDENT_AMBULATORY_CARE_PROVIDER_SITE_OTHER): Payer: Self-pay | Admitting: Family Medicine

## 2018-11-20 ENCOUNTER — Other Ambulatory Visit: Payer: Self-pay

## 2018-11-20 VITALS — BP 133/83 | HR 74 | Temp 97.9°F | Ht 65.0 in | Wt 176.0 lb

## 2018-11-20 DIAGNOSIS — E669 Obesity, unspecified: Secondary | ICD-10-CM

## 2018-11-20 DIAGNOSIS — E038 Other specified hypothyroidism: Secondary | ICD-10-CM

## 2018-11-20 DIAGNOSIS — E559 Vitamin D deficiency, unspecified: Secondary | ICD-10-CM

## 2018-11-20 DIAGNOSIS — Z6830 Body mass index (BMI) 30.0-30.9, adult: Secondary | ICD-10-CM | POA: Diagnosis not present

## 2018-11-20 NOTE — Progress Notes (Signed)
Office: [phone]  /  Fax: [phone]   HPI:   Chief Complaint: OBESITY Morgan Mejia is here to discuss her progress with her obesity treatment plan. She is on the keep a food journal with 1300-1450 calories and 80+ grams of protein daily or follow the Category 2 plan + 100 calories and is following her eating plan approximately 85 % of the time. She states she is exercising 0 minutes 0 times per week. For the past few weeks, Morgan Mejia states she may not have followed the plan as strictly as she did the first 2 weeks. She went out to eat and had wine more frequently. She hasn't been measuring all the food like she was previously.  Her weight is 176 lb (79.8 kg) today and has not lost weight since her last visit. She has lost 5 lbs since starting treatment with [place].  Vitamin D Deficiency Morgan Mejia has a diagnosis of vitamin D deficiency. She is currently taking prescription Vit D. She notes fatigue and denies nausea, vomiting or muscle weakness.  Hypothyroidism Morgan Mejia has a diagnosis of hypothyroidism. She is on Synthroid. She denies hot or cold intolerance or palpitations, but does admit to ongoing fatigue.  ASSESSMENT AND PLAN:  Vitamin D deficiency  Other specified hypothyroidism  Class 1 obesity with serious comorbidity and body mass index (BMI) of 30.0 to 30.9 in adult, unspecified obesity type - Starting BMI greater then 30  PLAN:  Vitamin D Deficiency Chene was informed that low vitamin D levels contributes to fatigue and are associated with obesity, breast, and colon cancer. Glorian agrees to continue taking prescription Vit D ,000 IU every week, no refill needed. She will follow up for routine testing of vitamin D, at least 2-3 times per year. She was informed of the risk of over-replacement of vitamin D and agrees to not increase her dose unless she discusses this with [place] first. Morgan Mejia agrees to follow up with our clinic in 2 weeks.  Hypothyroidism Ruthellen was informed of  the importance of good thyroid control to help with weight loss efforts. She was also informed that supertheraputic thyroid levels are dangerous and will not improve weight loss results. Essica agrees to continue taking Synthroid, and she agrees to follow up with our clinic in 2 weeks.  I spent > than 50% of the 15 minute visit on counseling as documented in the note.  Obesity Morgan Mejia is currently in the action stage of change. As such, her goal is to continue with weight loss efforts She has agreed to keep a food journal with 400-500 calories and 35+ grams of protein at supper daily and follow the Category 2 plan + 100 calories Kylin has been instructed to work up to a goal of 150 minutes of combined cardio and strengthening exercise per week for weight loss and overall health benefits. We discussed the following Behavioral Modification Strategies today: increasing lean protein intake, increasing vegetables, decrease eating out, work on meal planning and easy cooking plans, better snacking choices, and avoiding temptations   Rupa has agreed to follow up with our clinic in 2 weeks. She was informed of the importance of frequent follow up visits to maximize her success with intensive lifestyle modifications for her multiple health conditions.  ALLERGIES: Allergies  Allergen Reactions  . Aspirin   . Wellbutrin [Bupropion]     hives    MEDICATIONS: Current Outpatient Medications on File Prior to Visit  Medication Sig Dispense Refill  . cholecalciferol (VITAMIN D) 1000 units tablet Take  1 tablet (1,000 Units total) by mouth daily. 100 tablet 3  . Cyanocobalamin (VITAMIN B-12) 1000 MCG SUBL Place 1 tablet (1,000 mcg total) under the tongue daily. 100 tablet 3  . levothyroxine (SYNTHROID, LEVOTHROID) 137 MCG tablet Take 137 mcg by mouth daily before breakfast.    . TRINTELLIX 10 MG TABS tablet TAKE 1 TABLET EVERY DAY 30 tablet 5  . Vitamin D, Ergocalciferol, (DRISDOL) 1.25 MG (50000 UT)  CAPS capsule Take 1 capsule (50,000 Units total) by mouth every 7 (seven) days. 4 capsule 0   No current facility-administered medications on file prior to visit.     PAST MEDICAL HISTORY: Past Medical History:  Diagnosis Date  . Anxiety   . B12 deficiency   . Back pain   . Chicken pox   . Depression   . GERD (gastroesophageal reflux disease)   . Hypothyroidism   . Swelling   . Thyroid disease   . Vitamin D deficiency     PAST SURGICAL HISTORY: Past Surgical History:  Procedure Laterality Date  . Bone Spur    . CESAREAN SECTION  04/2002  . CESAREAN SECTION  11/2006    SOCIAL HISTORY: Social History   Tobacco Use  . Smoking status: Former Games developer  . Smokeless tobacco: Never Used  Substance Use Topics  . Alcohol use: Yes    Alcohol/week: 0.0 standard drinks  . Drug use: Never    FAMILY HISTORY: Family History  Problem Relation Age of Onset  . Diabetes Mother   . Hyperlipidemia Mother   . Uterine cancer Mother 45  . Pernicious anemia Mother   . Hypertension Mother   . Thyroid disease Mother   . Depression Mother   . Anxiety disorder Mother   . Obesity Mother   . Cancer Father 69       ?leukemia  . Alcoholism Father   . Pernicious anemia Maternal Aunt     ROS: Review of Systems  Constitutional: Positive for malaise/fatigue. Negative for weight loss.  Cardiovascular: Negative for palpitations.  Gastrointestinal: Negative for nausea and vomiting.  Musculoskeletal:       Negative muscle weakness  Endo/Heme/Allergies:       Negative hot/cold intolerance    PHYSICAL EXAM: Blood pressure 133/83, pulse 74, temperature 97.9 F (36.6 C), temperature source Oral, height 5\' 5"  (1.651 m), weight 176 lb (79.8 kg), SpO2 98 %. Body mass index is 29.29 kg/m. Physical Exam Vitals signs reviewed.  Constitutional:      Appearance: Normal appearance. She is obese.  Cardiovascular:     Rate and Rhythm: Normal rate.     Pulses: Normal pulses.  Pulmonary:      Effort: Pulmonary effort is normal.     Breath sounds: Normal breath sounds.  Musculoskeletal: Normal range of motion.  Skin:    General: Skin is warm and dry.  Neurological:     Mental Status: She is alert and oriented to person, place, and time.  Psychiatric:        Mood and Affect: Mood normal.        Behavior: Behavior normal.     RECENT LABS AND TESTS: BMET    Component Value Date/Time   NA 136 09/12/2018 1051   K 4.2 09/12/2018 1051   CL 104 09/12/2018 1051   CO2 26 09/12/2018 1051   GLUCOSE 105 (H) 09/12/2018 1051   BUN 10 09/12/2018 1051   CREATININE 0.76 09/12/2018 1051   CALCIUM 9.5 09/12/2018 1051   Lab Results  Component  Value Date   HGBA1C 5.3 10/17/2018   Lab Results  Component Value Date   INSULIN 8.2 10/17/2018   CBC    Component Value Date/Time   WBC 5.9 09/12/2018 1051   RBC 4.26 09/12/2018 1051   HGB 13.5 09/12/2018 1051   HCT 39.4 09/12/2018 1051   PLT 175.0 09/12/2018 1051   MCV 92.6 09/12/2018 1051   MCHC 34.3 09/12/2018 1051   RDW 12.9 09/12/2018 1051   LYMPHSABS 1.8 09/12/2018 1051   MONOABS 0.6 09/12/2018 1051   EOSABS 0.2 09/12/2018 1051   BASOSABS 0.1 09/12/2018 1051   Iron/TIBC/Ferritin/ %Sat No results found for: IRON, TIBC, FERRITIN, IRONPCTSAT Lipid Panel     Component Value Date/Time   CHOL 162 10/17/2018 1006   TRIG 70 10/17/2018 1006   HDL 52 10/17/2018 1006   CHOLHDL 3 04/14/2018 0919   VLDL 18.6 04/14/2018 0919   LDLCALC 96 10/17/2018 1006   Hepatic Function Panel     Component Value Date/Time   PROT 7.0 09/12/2018 1051   ALBUMIN 4.4 09/12/2018 1051   AST 17 09/12/2018 1051   ALT 12 09/12/2018 1051   ALKPHOS 40 09/12/2018 1051   BILITOT 0.5 09/12/2018 1051   BILIDIR 0.1 09/12/2018 1051      Component Value Date/Time   TSH 2.40 09/12/2018 1051   TSH 1.49 04/14/2018 0919      OBESITY BEHAVIORAL INTERVENTION VISIT  Today's visit was # 3   Starting weight: 181 lbs Starting date: 10/17/2018 Today's  weight : 176 lbs  Today's date: 11/20/2018 Total lbs lost to date: 5    11/20/2018  Height 5\' 5"  (1.651 m)  Weight 176 lb (79.8 kg)  BMI (Calculated) 29.29  BLOOD PRESSURE - SYSTOLIC 133  BLOOD PRESSURE - DIASTOLIC 83   Body Fat % 37.9 %  Total Body Water (lbs) 74 lbs     ASK: We discussed the diagnosis of obesity with Donnie Coffin today and [name] agreed to give [place] permission to discuss obesity behavioral modification therapy today.  ASSESS: Tynlee has the diagnosis of obesity and her BMI today is 29.29 Kaeda is in the action stage of change   ADVISE: Brendon was educated on the multiple health risks of obesity as well as the benefit of weight loss to improve her health. She was advised of the need for long term treatment and the importance of lifestyle modifications to improve her current health and to decrease her risk of future health problems.  AGREE: Multiple dietary modification options and treatment options were discussed and  Ytzel agreed to follow the recommendations documented in the above note.  ARRANGE: Anique was educated on the importance of frequent visits to treat obesity as outlined per CMS and USPSTF guidelines and agreed to schedule her next follow up appointment today.  I, Burt Knack, am acting as transcriptionist for Debbra Riding, MD  I have reviewed the above documentation for accuracy and completeness, and I agree with the above. - Debbra Riding, MD

## 2018-11-22 ENCOUNTER — Other Ambulatory Visit (INDEPENDENT_AMBULATORY_CARE_PROVIDER_SITE_OTHER): Payer: Self-pay | Admitting: Family Medicine

## 2018-11-22 DIAGNOSIS — E559 Vitamin D deficiency, unspecified: Secondary | ICD-10-CM

## 2018-11-30 ENCOUNTER — Encounter (INDEPENDENT_AMBULATORY_CARE_PROVIDER_SITE_OTHER): Payer: Self-pay

## 2018-12-06 ENCOUNTER — Ambulatory Visit (INDEPENDENT_AMBULATORY_CARE_PROVIDER_SITE_OTHER): Payer: 59 | Admitting: Family Medicine

## 2019-03-08 ENCOUNTER — Encounter: Payer: Self-pay | Admitting: Internal Medicine

## 2019-03-08 ENCOUNTER — Other Ambulatory Visit: Payer: Self-pay

## 2019-03-08 ENCOUNTER — Ambulatory Visit (INDEPENDENT_AMBULATORY_CARE_PROVIDER_SITE_OTHER): Payer: 59 | Admitting: Internal Medicine

## 2019-03-08 VITALS — BP 124/84 | HR 78 | Temp 98.2°F | Ht 65.0 in | Wt 187.0 lb

## 2019-03-08 DIAGNOSIS — Z23 Encounter for immunization: Secondary | ICD-10-CM

## 2019-03-08 DIAGNOSIS — S5011XA Contusion of right forearm, initial encounter: Secondary | ICD-10-CM

## 2019-03-08 DIAGNOSIS — S5010XA Contusion of unspecified forearm, initial encounter: Secondary | ICD-10-CM | POA: Insufficient documentation

## 2019-03-08 NOTE — Assessment & Plan Note (Addendum)
Rice bag Arnica ointment tDAP

## 2019-03-08 NOTE — Progress Notes (Signed)
Subjective:  Patient ID: Morgan Mejia, female    DOB: [birth year]  Age: 48 y.o. MRN: [id number]  CC: No chief complaint on file.   HPI Morgan Mejia presents for R forearm injury - she fell in the garage, hit the arm. It was black and blue. It happened 5 weeks ago. Pain is mild...  Outpatient Medications Prior to Visit  Medication Sig Dispense Refill   cholecalciferol (VITAMIN D) 1000 units tablet Take 1 tablet (1,000 Units total) by mouth daily. 100 tablet 3   Cyanocobalamin (VITAMIN B-12) 1000 MCG SUBL Place 1 tablet (1,000 mcg total) under the tongue daily. 100 tablet 3   levothyroxine (SYNTHROID, LEVOTHROID) 137 MCG tablet Take 137 mcg by mouth daily before breakfast.     TRINTELLIX 10 MG TABS tablet TAKE 1 TABLET EVERY DAY 30 tablet 5   Vitamin D, Ergocalciferol, (DRISDOL) 1.25 MG (50000 UT) CAPS capsule Take 1 capsule (50,000 Units total) by mouth every 7 (seven) days. 4 capsule 0   No facility-administered medications prior to visit.     ROS: Review of Systems  Constitutional: Negative for fever.  Musculoskeletal: Negative for arthralgias.  Skin: Positive for color change. Negative for rash and wound.  Neurological: Negative for weakness.  Hematological: Does not bruise/bleed easily.    Objective:  BP 124/84 (BP Location: Left Arm, Patient Position: Sitting, Cuff Size: Large)    Pulse 78    Temp 98.2 F (36.8 C) (Oral)    Ht 5\' 5"  (1.651 m)    Wt 187 lb (84.8 kg)    SpO2 97%    BMI 31.12 kg/m   BP Readings from Last 3 Encounters:  03/08/19 124/84  11/20/18 133/83  10/31/18 (!) 141/85    Wt Readings from Last 3 Encounters:  03/08/19 187 lb (84.8 kg)  11/20/18 176 lb (79.8 kg)  10/31/18 176 lb (79.8 kg)    Physical Exam Constitutional:      Appearance: Normal appearance.  Musculoskeletal:        General: Swelling and tenderness present.  Skin:    Findings: No bruising.  Neurological:     Mental Status: She is alert and oriented to person, place,  and time.     Coordination: Coordination normal.     Gait: Gait normal.     POC US c/w sq hematoma R forearm hematoma - a small egg size Lab Results  Component Value Date   WBC 5.9 09/12/2018   HGB 13.5 09/12/2018   HCT 39.4 09/12/2018   PLT 175.0 09/12/2018   GLUCOSE 105 (H) 09/12/2018   CHOL 162 10/17/2018   TRIG 70 10/17/2018   HDL 52 10/17/2018   LDLCALC 96 10/17/2018   ALT 12 09/12/2018   AST 17 09/12/2018   NA 136 09/12/2018   K 4.2 09/12/2018   CL 104 09/12/2018   CREATININE 0.76 09/12/2018   BUN 10 09/12/2018   CO2 26 09/12/2018   TSH 2.40 09/12/2018   HGBA1C 5.3 10/17/2018    [place] Breast Right  Result Date: 02/02/2013 *RADIOLOGY REPORT* Clinical Data:  48 year old female with abnormal screening mammogram - possible right breast mass. DIGITAL DIAGNOSTIC RIGHT MAMMOGRAM  AND RIGHT BREAST ULTRASOUND: Comparison:  01/17/2013 and prior mammograms dating back to 05/23/2008 Findings: ACR Breast Density Category 3: The breast tissue is heterogeneously dense. Spot compression views of the right breast demonstrate minimal increased density in the lower outer right breast, in the area of the screening study finding.  There is no evidence of persistent mass, distortion  or worrisome calcifications. On physical exam, no palpable abnormalities identified within the lower outer right breast. Ultrasound is performed, showing no evidence of solid or cystic mass, distortion or abnormal areas of shadowing within the lower outer right breast.  An island of normal fibroglandular tissue is present. IMPRESSION: No suspicious mammographic, palpable or sonographic abnormalities within the lower outer right breast, in the area of the screening study finding. BI-RADS CATEGORY 1:  Negative. RECOMMENDATION: Bilateral screening mammograms in 1 year. I have discussed the findings and recommendations with the patient. Results were also provided in writing at the conclusion of the visit.  If applicable, a  reminder letter will be sent to the patient regarding the next appointment. Original Report Authenticated By: Margarette Canada, M.D.   Mm Digital Diagnostic Unilat R  Result Date: 02/02/2013 *RADIOLOGY REPORT* Clinical Data:  48 year old female with abnormal screening mammogram - possible right breast mass. DIGITAL DIAGNOSTIC RIGHT MAMMOGRAM  AND RIGHT BREAST ULTRASOUND: Comparison:  01/17/2013 and prior mammograms dating back to 05/23/2008 Findings: ACR Breast Density Category 3: The breast tissue is heterogeneously dense. Spot compression views of the right breast demonstrate minimal increased density in the lower outer right breast, in the area of the screening study finding.  There is no evidence of persistent mass, distortion or worrisome calcifications. On physical exam, no palpable abnormalities identified within the lower outer right breast. Ultrasound is performed, showing no evidence of solid or cystic mass, distortion or abnormal areas of shadowing within the lower outer right breast.  An island of normal fibroglandular tissue is present. IMPRESSION: No suspicious mammographic, palpable or sonographic abnormalities within the lower outer right breast, in the area of the screening study finding. BI-RADS CATEGORY 1:  Negative. RECOMMENDATION: Bilateral screening mammograms in 1 year. I have discussed the findings and recommendations with the patient. Results were also provided in writing at the conclusion of the visit.  If applicable, a reminder letter will be sent to the patient regarding the next appointment. Original Report Authenticated By: Margarette Canada, M.D.    Assessment & Plan:   There are no diagnoses linked to this encounter.   No orders of the defined types were placed in this encounter.    Follow-up: No follow-ups on file.  Walker Kehr, MD

## 2019-03-08 NOTE — Patient Instructions (Addendum)
Rice bag Arnica ointment   Hematoma A hematoma is a collection of blood. A hematoma can happen:  Under the skin.  In an organ.  In a body space.  In a joint space.  In other tissues. The blood can thicken (clot) to form a lump that you can see and feel. The lump is often hard and may become sore and tender. The lump can be very small or very big. Most hematomas get better in a few days to weeks. However, some hematomas may be serious and need medical care. What are the causes? This condition is caused by:  An injury.  Blood that leaks under the skin.  Problems from surgeries.  Medical conditions that cause bleeding or bruising. What increases the risk? You are more likely to develop this condition if:  You are an older adult.  You use medicines that thin your blood. What are the signs or symptoms? Symptoms depend on where the hematoma is in your body.  If the hematoma is under the skin, there is: ? A firm lump on the body. ? Pain and tenderness in the area. ? Bruising. The skin above the lump may be blue, dark blue, purple-red, or yellowish.  If the hematoma is deep in the tissues or body spaces, there may be: ? Blood in the stomach. This may cause pain in the belly (abdomen), weakness, passing out (fainting), and shortness of breath. ? Blood in the head. This may cause a headache, weakness, trouble speaking or understanding speech, or passing out. How is this diagnosed? This condition is diagnosed based on:  Your medical history.  A physical exam.  Imaging tests, such as ultrasound or CT scan.  Blood tests. How is this treated? Treatment depends on the cause, size, and location of the hematoma. Treatment may include:  Doing nothing. Many hematomas go away on their own without treatment.  Surgery or close monitoring. This may be needed for large hematomas or hematomas that affect the body's organs.  Medicines. These may be given if a medical condition  caused the hematoma. Follow these instructions at home: Managing pain, stiffness, and swelling   If told, put ice on the area. ? Put ice in a plastic bag. ? Place a towel between your skin and the bag. ? Leave the ice on for 20 minutes, 2-3 times a day for the first two days.  If told, put heat on the affected area after putting ice on the area for two days. Use the heat source that your doctor tells you to use. This could be a moist heat pack or a heating pad. To do this: ? Place a towel between your skin and the heat source. ? Leave the heat on for 20-30 minutes. ? Remove the heat if your skin turns bright red. This is very important if you are unable to feel pain, heat, or cold. You may have a greater risk of getting burned.  Raise (elevate) the affected area above the level of your heart while you are sitting or lying down.  Wrap the affected area with an elastic bandage, if told by your doctor. Do not wrap the bandage too tightly.  If your hematoma is on a leg or foot and is painful, your doctor may give you crutches. Use them as told by your doctor. General instructions  Take over-the-counter and prescription medicines only as told by your doctor.  Keep all follow-up visits as told by your doctor. This is important. Contact a doctor  if:  You have a fever.  The swelling or bruising gets worse.  You start to get more hematomas. Get help right away if:  Your pain gets worse.  Your pain is not getting better with medicine.  Your skin over the hematoma breaks or starts to bleed.  Your hematoma is in your chest or belly and you: ? Pass out. ? Feel weak. ? Become short of breath.  You have a hematoma on your scalp that is caused by a fall or injury, and you: ? Have a headache that gets worse. ? Have trouble speaking or understanding speech. ? Become less alert or you pass out. Summary  A hematoma is a collection of blood in any part of your body.  Most hematomas  get better on their own in a few days to weeks. Some may need medical care.  Follow instructions from your doctor about how to care for your hematoma.  Contact a doctor if the swelling or bruising gets worse, or if you are short of breath. This information is not intended to replace advice given to you by your health care provider. Make sure you discuss any questions you have with your health care provider. Document Released: 09/30/2004 Document Revised: 01/26/2018 Document Reviewed: 01/26/2018 Elsevier Patient Education  2020 [employer].

## 2019-03-13 ENCOUNTER — Ambulatory Visit: Payer: 59 | Admitting: Internal Medicine

## 2019-03-20 ENCOUNTER — Other Ambulatory Visit: Payer: Self-pay | Admitting: Internal Medicine

## 2019-07-19 ENCOUNTER — Encounter (INDEPENDENT_AMBULATORY_CARE_PROVIDER_SITE_OTHER): Payer: Self-pay | Admitting: Family Medicine

## 2019-07-19 ENCOUNTER — Other Ambulatory Visit: Payer: Self-pay

## 2019-07-19 ENCOUNTER — Ambulatory Visit (INDEPENDENT_AMBULATORY_CARE_PROVIDER_SITE_OTHER): Payer: 59 | Admitting: Family Medicine

## 2019-07-19 VITALS — BP 146/85 | HR 75 | Temp 98.3°F | Ht 65.0 in | Wt 182.0 lb

## 2019-07-19 DIAGNOSIS — E669 Obesity, unspecified: Secondary | ICD-10-CM | POA: Diagnosis not present

## 2019-07-19 DIAGNOSIS — R030 Elevated blood-pressure reading, without diagnosis of hypertension: Secondary | ICD-10-CM | POA: Diagnosis not present

## 2019-07-19 DIAGNOSIS — Z9189 Other specified personal risk factors, not elsewhere classified: Secondary | ICD-10-CM

## 2019-07-19 DIAGNOSIS — Z6830 Body mass index (BMI) 30.0-30.9, adult: Secondary | ICD-10-CM

## 2019-07-19 DIAGNOSIS — E559 Vitamin D deficiency, unspecified: Secondary | ICD-10-CM

## 2019-07-19 MED ORDER — VITAMIN D (ERGOCALCIFEROL) 1.25 MG (50000 UNIT) PO CAPS: 50000.0000 [IU] | ORAL_CAPSULE | ORAL | 0 refills | Status: DC

## 2019-07-24 NOTE — Progress Notes (Signed)
Office: [phone]  /  Fax: [phone]   HPI:   Chief Complaint: OBESITY Morgan Mejia is here to discuss her progress with her obesity treatment plan. She is on the keep a food journal with 400-500 calories and 35+ grams of protein at supper daily and follow the Category 2 plan + 100 calories and is following her eating plan approximately 50 % of the time. She states she is exercising 0 minutes 0 times per week. Morgan Mejia's last visit was 8 months ago. She had gotten off track with COVID, but states she is ready to get back on track.  Her weight is 182 lb (82.6 kg) today and has gained 6 lbs since her last visit. She has lost 0 lbs since starting treatment with Morgan Mejia.  Vitamin D Deficiency Morgan Mejia has a diagnosis of vitamin D deficiency. She is not currently taking Vit D and level is not at goal. She notes fatigue and denies nausea, vomiting or muscle weakness.  At risk for osteopenia and osteoporosis Morgan Mejia is at higher risk of osteopenia and osteoporosis due to vitamin D deficiency.   Elevated Blood Pressure without History of Hypertension Morgan Mejia's blood pressure is elevated today. She has gained some weight, but is ready to get back on track with diet, exercise, and weight loss.  ASSESSMENT AND PLAN:  Vitamin D deficiency - Plan: Vitamin D, Ergocalciferol, (DRISDOL) 1.25 MG (50000 UT) CAPS capsule  Elevated BP without diagnosis of hypertension  At risk for osteoporosis  Class 1 obesity with serious comorbidity and body mass index (BMI) of 30.0 to 30.9 in adult, unspecified obesity type  PLAN:  Vitamin D Deficiency Morgan Mejia was informed that low vitamin D levels contributes to fatigue and are associated with obesity, breast, and colon cancer. Morgan Mejia agrees to restart prescription Vit D 50,000 IU every week #4 with no refills. She will follow up for routine testing of vitamin D, at least 2-3 times per year. She was informed of the risk of over-replacement of vitamin D and agrees to  not increase her dose unless she discusses this with Morgan Mejia first. Morgan Mejia agrees to follow up with our clinic in 3 weeks.  At risk for osteopenia and osteoporosis Morgan Mejia was given extended (15 minutes) osteoporosis prevention counseling today. Morgan Mejia is at risk for osteopenia and osteoporsis due to her vitamin D deficiency. She was encouraged to take her vitamin D and follow her higher calcium diet and increase strengthening exercise to help strengthen her bones and decrease her risk of osteopenia and osteoporosis.  Elevated Blood Pressure without History of Hypertension We discussed sodium restriction, working on healthy weight loss, and a regular exercise program as the means to achieve improved blood pressure control. Morgan Mejia agreed with this plan and agreed to follow up as directed. We will continue to monitor her blood pressure as well as her progress with the above lifestyle modifications. She will continue with diet and exercise, and will watch for signs of hypotension as she continues her lifestyle modifications. We will recheck her blood pressure in 3 weeks.  Obesity Morgan Mejia is currently in the action stage of change. As such, her goal is to continue with weight loss efforts She has agreed to keep a food journal with 350-500 calories and 35+ grams of protein at super daily and follow the Category 2 plan Morgan Mejia has been instructed to work up to a goal of 150 minutes of combined cardio and strengthening exercise per week for weight loss and overall health benefits. We discussed the following Behavioral  Modification Strategies today: increasing lean protein intake, work on meal planning and easy cooking plans and holiday eating strategies    Morgan Mejia has agreed to follow up with our clinic in 3 weeks. She was informed of the importance of frequent follow up visits to maximize her success with intensive lifestyle modifications for her multiple health conditions.  ALLERGIES: Allergies    Allergen Reactions   Aspirin    Wellbutrin [Bupropion]     hives    MEDICATIONS: Current Outpatient Medications on File Prior to Visit  Medication Sig Dispense Refill   Cyanocobalamin (VITAMIN B-12) 1000 MCG SUBL Place 1 tablet (1,000 mcg total) under the tongue daily. 100 tablet 3   levothyroxine (SYNTHROID, LEVOTHROID) 137 MCG tablet Take 137 mcg by mouth daily before breakfast.     TRINTELLIX 10 MG TABS tablet TAKE 1 TABLET BY MOUTH EVERY DAY 90 tablet 3   No current facility-administered medications on file prior to visit.     PAST MEDICAL HISTORY: Past Medical History:  Diagnosis Date   Anxiety    B12 deficiency    Back pain    Chicken pox    Depression    GERD (gastroesophageal reflux disease)    Hypothyroidism    Swelling    Thyroid disease    Vitamin D deficiency     PAST SURGICAL HISTORY: Past Surgical History:  Procedure Laterality Date   Bone Spur     CESAREAN SECTION  04/2002   CESAREAN SECTION  11/2006    SOCIAL HISTORY: Social History   Tobacco Use   Smoking status: Former Smoker   Smokeless tobacco: Never Used  Substance Use Topics   Alcohol use: Yes    Alcohol/week: 0.0 standard drinks   Drug use: Never    FAMILY HISTORY: Family History  Problem Relation Age of Onset   Diabetes Mother    Hyperlipidemia Mother    Uterine cancer Mother 34   Pernicious anemia Mother    Hypertension Mother    Thyroid disease Mother    Depression Mother    Anxiety disorder Mother    Obesity Mother    Cancer Father 44       ?leukemia   Alcoholism Father    Pernicious anemia Maternal Aunt     ROS: Review of Systems  Constitutional: Positive for malaise/fatigue. Negative for weight loss.  Cardiovascular: Negative for chest pain.  Gastrointestinal: Negative for nausea and vomiting.  Musculoskeletal:       Negative muscle weakness    PHYSICAL EXAM: Blood pressure (!) 146/85, pulse 75, temperature 98.3 F (36.8  C), temperature source Oral, height 5\' 5"  (1.651 m), weight 182 lb (82.6 kg), SpO2 98 %. Body mass index is 30.29 kg/m. Physical Exam Vitals signs reviewed.  Constitutional:      Appearance: Normal appearance. She is obese.  Cardiovascular:     Rate and Rhythm: Normal rate.     Pulses: Normal pulses.  Pulmonary:     Effort: Pulmonary effort is normal.     Breath sounds: Normal breath sounds.  Musculoskeletal: Normal range of motion.  Skin:    General: Skin is warm and dry.  Neurological:     Mental Status: She is alert and oriented to person, place, and time.  Psychiatric:        Mood and Affect: Mood normal.        Behavior: Behavior normal.     RECENT LABS AND TESTS: BMET    Component Value Date/Time   NA 136 09/12/2018  1051   K 4.2 09/12/2018 1051   CL 104 09/12/2018 1051   CO2 26 09/12/2018 1051   GLUCOSE 105 (H) 09/12/2018 1051   BUN 10 09/12/2018 1051   CREATININE 0.76 09/12/2018 1051   CALCIUM 9.5 09/12/2018 1051   Lab Results  Component Value Date   HGBA1C 5.3 10/17/2018   Lab Results  Component Value Date   INSULIN 8.2 10/17/2018   CBC    Component Value Date/Time   WBC 5.9 09/12/2018 1051   RBC 4.26 09/12/2018 1051   HGB 13.5 09/12/2018 1051   HCT 39.4 09/12/2018 1051   PLT 175.0 09/12/2018 1051   MCV 92.6 09/12/2018 1051   MCHC 34.3 09/12/2018 1051   RDW 12.9 09/12/2018 1051   LYMPHSABS 1.8 09/12/2018 1051   MONOABS 0.6 09/12/2018 1051   EOSABS 0.2 09/12/2018 1051   BASOSABS 0.1 09/12/2018 1051   Iron/TIBC/Ferritin/ %Sat No results found for: IRON, TIBC, FERRITIN, IRONPCTSAT Lipid Panel     Component Value Date/Time   CHOL 162 10/17/2018 1006   TRIG 70 10/17/2018 1006   HDL 52 10/17/2018 1006   CHOLHDL 3 04/14/2018 0919   VLDL 18.6 04/14/2018 0919   LDLCALC 96 10/17/2018 1006   Hepatic Function Panel     Component Value Date/Time   PROT 7.0 09/12/2018 1051   ALBUMIN 4.4 09/12/2018 1051   AST 17 09/12/2018 1051   ALT 12  09/12/2018 1051   ALKPHOS 40 09/12/2018 1051   BILITOT 0.5 09/12/2018 1051   BILIDIR 0.1 09/12/2018 1051      Component Value Date/Time   TSH 2.40 09/12/2018 1051   TSH 1.49 04/14/2018 0919      OBESITY BEHAVIORAL INTERVENTION VISIT  Today's visit was # 4   Starting weight: 181 lbs Starting date: 10/17/2018 Today's weight : 182 lbs  Today's date: 07/19/2019 Total lbs lost to date: 0    ASK: We discussed the diagnosis of obesity with Morgan Mejia today and Morgan Mejia agreed to give [place] permission to discuss obesity behavioral modification therapy today.  ASSESS: Atara has the diagnosis of obesity and her BMI today is 30.29 Elizebeth is in the action stage of change   ADVISE: Aniyah was educated on the multiple health risks of obesity as well as the benefit of weight loss to improve her health. She was advised of the need for long term treatment and the importance of lifestyle modifications to improve her current health and to decrease her risk of future health problems.  AGREE: Multiple dietary modification options and treatment options were discussed and  Rei agreed to follow the recommendations documented in the above note.  ARRANGE: Patricie was educated on the importance of frequent visits to treat obesity as outlined per CMS and USPSTF guidelines and agreed to schedule her next follow up appointment today.  I, Trixie Dredge, am acting as transcriptionist for Dennard Nip, MD  I have reviewed the above documentation for accuracy and completeness, and I agree with the above. -Dennard Nip, MD

## 2019-08-08 ENCOUNTER — Encounter (INDEPENDENT_AMBULATORY_CARE_PROVIDER_SITE_OTHER): Payer: Self-pay | Admitting: Family Medicine

## 2019-08-08 ENCOUNTER — Other Ambulatory Visit: Payer: Self-pay

## 2019-08-08 ENCOUNTER — Ambulatory Visit (INDEPENDENT_AMBULATORY_CARE_PROVIDER_SITE_OTHER): Payer: 59 | Admitting: Family Medicine

## 2019-08-08 VITALS — BP 147/83 | HR 81 | Temp 98.2°F | Ht 65.0 in | Wt 180.0 lb

## 2019-08-08 DIAGNOSIS — Z9189 Other specified personal risk factors, not elsewhere classified: Secondary | ICD-10-CM

## 2019-08-08 DIAGNOSIS — E669 Obesity, unspecified: Secondary | ICD-10-CM

## 2019-08-08 DIAGNOSIS — Z6830 Body mass index (BMI) 30.0-30.9, adult: Secondary | ICD-10-CM

## 2019-08-08 DIAGNOSIS — E559 Vitamin D deficiency, unspecified: Secondary | ICD-10-CM | POA: Diagnosis not present

## 2019-08-08 DIAGNOSIS — I10 Essential (primary) hypertension: Secondary | ICD-10-CM | POA: Diagnosis not present

## 2019-08-08 MED ORDER — VITAMIN D (ERGOCALCIFEROL) 1.25 MG (50000 UNIT) PO CAPS: 50000.0000 [IU] | ORAL_CAPSULE | ORAL | 0 refills | Status: DC

## 2019-08-08 MED ORDER — HYDROCHLOROTHIAZIDE 12.5 MG PO TABS: 12.5000 mg | ORAL_TABLET | Freq: Every day | ORAL | 0 refills | Status: DC

## 2019-08-09 NOTE — Progress Notes (Signed)
Office: [phone]  /  Fax: [phone]   HPI:   Chief Complaint: OBESITY Morgan Mejia is here to discuss her progress with her obesity treatment plan. She is on the keep a food journal with 350-500 calories and 35+ grams of protein at supper daily and follow the Category 2 plan and is following her eating plan approximately 75 % of the time. She states she is exercising 0 minutes 0 times per week. Morgan Mejia continues to do well with weight loss even over Thanksgiving. She is ready to start exercising and would like to discuss what to do.  Her weight is 180 lb (81.6 kg) today and has had a weight loss of 2 pounds over a period of 3 weeks since her last visit. She has lost 1 lb since starting treatment with [place].  Vitamin D Deficiency Morgan Mejia has a diagnosis of vitamin D deficiency. She is stable on prescription Vit D. She denies nausea, vomiting or muscle weakness.  Hypertension Morgan Mejia is a 48 y.o. female with hypertension. [name]'s blood pressure is elevated again. She has had multiple mildly elevated readings. She denies chest pain. She is working on weight loss to help control her blood pressure with the goal of decreasing her risk of heart attack and stroke.  At risk for cardiovascular disease Morgan Mejia is at a higher than average risk for cardiovascular disease due to obesity and hypertension. She currently denies any chest pain.  ASSESSMENT AND PLAN:  Vitamin D deficiency - Plan: Vitamin D, Ergocalciferol, (DRISDOL) 1.25 MG (50000 UT) CAPS capsule  Essential hypertension - Plan: hydrochlorothiazide (HYDRODIURIL) 12.5 MG tablet  At risk for heart disease  Class 1 obesity with serious comorbidity and body mass index (BMI) of 30.0 to 30.9 in adult, unspecified obesity type  PLAN:  Vitamin D Deficiency Morgan Mejia was informed that low vitamin D levels contributes to fatigue and are associated with obesity, breast, and colon cancer. Morgan Mejia agrees to continue taking prescription  Vit D 50,000 IU every week #4 and we will refill for 1 month. She will follow up for routine testing of vitamin D, at least 2-3 times per year. She was informed of the risk of over-replacement of vitamin D and agrees to not increase her dose unless she discusses this with [place] first. Morgan Mejia agrees to follow up with our clinic in 2 to 3 weeks.  Hypertension We discussed sodium restriction, working on healthy weight loss, and a regular exercise program as the means to achieve improved blood pressure control. Morgan Mejia agreed with this plan and agreed to follow up as directed. We will continue to monitor her blood pressure as well as her progress with the above lifestyle modifications. Morgan Mejia agrees to start hydrochlorothiazide 12.5 mg PO q AM #30 with no refills. She will continue with diet and exercise, and will watch for signs of hypotension as she continues her lifestyle modifications. Morgan Mejia agrees to follow up with our clinic in 2 to 3 weeks.  Cardiovascular risk counseling Morgan Mejia was given extended (15 minutes) coronary artery disease prevention counseling today. She is 48 y.o. female and has risk factors for heart disease including obesity and hypertension. We discussed intensive lifestyle modifications today with an emphasis on specific weight loss instructions and strategies. Pt was also informed of the importance of increasing exercise and decreasing saturated fats to help prevent heart disease.  Obesity Morgan Mejia is currently in the action stage of change. As such, her goal is to continue with weight loss efforts She has agreed to keep a  food journal with 350-500 calories and 35+ grams of protein at supper daily and follow the Category 2 plan Morgan Mejia has been instructed to work up to a goal of 150 minutes of combined cardio and strengthening exercise per week or start with a variety of short activities to see what works best for her for weight loss and overall health benefits. We discussed the  following Behavioral Modification Strategies today: holiday eating strategies    Morgan Mejia has agreed to follow up with our clinic in 2 to 3 weeks. She was informed of the importance of frequent follow up visits to maximize her success with intensive lifestyle modifications for her multiple health conditions.  ALLERGIES: Allergies  Allergen Reactions   Aspirin    Wellbutrin [Bupropion]     hives    MEDICATIONS: Current Outpatient Medications on File Prior to Visit  Medication Sig Dispense Refill   Cyanocobalamin (VITAMIN B-12) 1000 MCG SUBL Place 1 tablet (1,000 mcg total) under the tongue daily. 100 tablet 3   levothyroxine (SYNTHROID, LEVOTHROID) 137 MCG tablet Take 137 mcg by mouth daily before breakfast.     TRINTELLIX 10 MG TABS tablet TAKE 1 TABLET BY MOUTH EVERY DAY 90 tablet 3   No current facility-administered medications on file prior to visit.     PAST MEDICAL HISTORY: Past Medical History:  Diagnosis Date   Anxiety    B12 deficiency    Back pain    Chicken pox    Depression    GERD (gastroesophageal reflux disease)    Hypothyroidism    Swelling    Thyroid disease    Vitamin D deficiency     PAST SURGICAL HISTORY: Past Surgical History:  Procedure Laterality Date   Bone Spur     CESAREAN SECTION  04/2002   CESAREAN SECTION  11/2006    SOCIAL HISTORY: Social History   Tobacco Use   Smoking status: Former Smoker   Smokeless tobacco: Never Used  Substance Use Topics   Alcohol use: Yes    Alcohol/week: 0.0 standard drinks   Drug use: Never    FAMILY HISTORY: Family History  Problem Relation Age of Onset   Diabetes Mother    Hyperlipidemia Mother    Uterine cancer Mother [id number]   Pernicious anemia Mother    Hypertension Mother    Thyroid disease Mother    Depression Mother    Anxiety disorder Mother    Obesity Mother    Cancer Father [phone]       ?leukemia   Alcoholism Father    Pernicious anemia Maternal Aunt      ROS: Review of Systems  Constitutional: Positive for weight loss.  Cardiovascular: Negative for chest pain.  Gastrointestinal: Negative for nausea and vomiting.  Musculoskeletal:       Negative muscle weakness    PHYSICAL EXAM: Blood pressure (!) 147/83, pulse 81, temperature 98.2 F (36.8 C), temperature source Oral, height 5\' 5"  (1.651 m), weight 180 lb (81.6 kg), SpO2 98 %. Body mass index is 29.95 kg/m. Physical Exam Vitals signs reviewed.  Constitutional:      Appearance: Normal appearance. She is obese.  Cardiovascular:     Rate and Rhythm: Normal rate.     Pulses: Normal pulses.  Pulmonary:     Effort: Pulmonary effort is normal.     Breath sounds: Normal breath sounds.  Musculoskeletal: Normal range of motion.  Skin:    General: Skin is warm and dry.  Neurological:     Mental Status: She is  alert and oriented to person, place, and time.  Psychiatric:        Mood and Affect: Mood normal.        Behavior: Behavior normal.     RECENT LABS AND TESTS: BMET    Component Value Date/Time   NA 136 09/12/2018 1051   K 4.2 09/12/2018 1051   CL 104 09/12/2018 1051   CO2 26 09/12/2018 1051   GLUCOSE 105 (H) 09/12/2018 1051   BUN 10 09/12/2018 1051   CREATININE 0.76 09/12/2018 1051   CALCIUM 9.5 09/12/2018 1051   Lab Results  Component Value Date   HGBA1C 5.3 10/17/2018   Lab Results  Component Value Date   INSULIN 8.2 10/17/2018   CBC    Component Value Date/Time   WBC 5.9 09/12/2018 1051   RBC 4.26 09/12/2018 1051   HGB 13.5 09/12/2018 1051   HCT 39.4 09/12/2018 1051   PLT 175.0 09/12/2018 1051   MCV 92.6 09/12/2018 1051   MCHC 34.3 09/12/2018 1051   RDW 12.9 09/12/2018 1051   LYMPHSABS 1.8 09/12/2018 1051   MONOABS 0.6 09/12/2018 1051   EOSABS 0.2 09/12/2018 1051   BASOSABS 0.1 09/12/2018 1051   Iron/TIBC/Ferritin/ %Sat No results found for: IRON, TIBC, FERRITIN, IRONPCTSAT Lipid Panel     Component Value Date/Time   CHOL 162 10/17/2018  1006   TRIG 70 10/17/2018 1006   HDL 52 10/17/2018 1006   CHOLHDL 3 04/14/2018 0919   VLDL 18.6 04/14/2018 0919   LDLCALC 96 10/17/2018 1006   Hepatic Function Panel     Component Value Date/Time   PROT 7.0 09/12/2018 1051   ALBUMIN 4.4 09/12/2018 1051   AST 17 09/12/2018 1051   ALT 12 09/12/2018 1051   ALKPHOS 40 09/12/2018 1051   BILITOT 0.5 09/12/2018 1051   BILIDIR 0.1 09/12/2018 1051      Component Value Date/Time   TSH 2.40 09/12/2018 1051   TSH 1.49 04/14/2018 0919      OBESITY BEHAVIORAL INTERVENTION VISIT  Today's visit was # 5   Starting weight: 181 lbs Starting date: 10/17/2018 Today's weight : 180 lbs Today's date: 08/08/2019 Total lbs lost to date: 1    ASK: We discussed the diagnosis of obesity with Morgan Mejia today and Morgan Mejia agreed to give [place] permission to discuss obesity behavioral modification therapy today.  ASSESS: Tomasita has the diagnosis of obesity and her BMI today is 29.95 Dunia is in the action stage of change   ADVISE: Lyndall was educated on the multiple health risks of obesity as well as the benefit of weight loss to improve her health. She was advised of the need for long term treatment and the importance of lifestyle modifications to improve her current health and to decrease her risk of future health problems.  AGREE: Multiple dietary modification options and treatment options were discussed and  Blakeley agreed to follow the recommendations documented in the above note.  ARRANGE: Jobina was educated on the importance of frequent visits to treat obesity as outlined per CMS and USPSTF guidelines and agreed to schedule her next follow up appointment today.  I, Trixie Dredge, am acting as transcriptionist for Dennard Nip, MD  I have reviewed the above documentation for accuracy and completeness, and I agree with the above. -Dennard Nip, MD

## 2019-08-22 ENCOUNTER — Ambulatory Visit (INDEPENDENT_AMBULATORY_CARE_PROVIDER_SITE_OTHER): Payer: 59 | Admitting: Family Medicine

## 2019-08-31 ENCOUNTER — Other Ambulatory Visit (INDEPENDENT_AMBULATORY_CARE_PROVIDER_SITE_OTHER): Payer: Self-pay | Admitting: Family Medicine

## 2019-08-31 DIAGNOSIS — I10 Essential (primary) hypertension: Secondary | ICD-10-CM

## 2019-09-11 ENCOUNTER — Other Ambulatory Visit (INDEPENDENT_AMBULATORY_CARE_PROVIDER_SITE_OTHER): Payer: Self-pay | Admitting: Family Medicine

## 2019-09-11 DIAGNOSIS — E559 Vitamin D deficiency, unspecified: Secondary | ICD-10-CM

## 2019-09-12 ENCOUNTER — Other Ambulatory Visit: Payer: Self-pay

## 2019-09-12 ENCOUNTER — Encounter (INDEPENDENT_AMBULATORY_CARE_PROVIDER_SITE_OTHER): Payer: Self-pay | Admitting: Family Medicine

## 2019-09-12 ENCOUNTER — Ambulatory Visit (INDEPENDENT_AMBULATORY_CARE_PROVIDER_SITE_OTHER): Payer: 59 | Admitting: Family Medicine

## 2019-09-12 VITALS — BP 149/80 | HR 79 | Temp 98.4°F | Ht 65.0 in | Wt 180.0 lb

## 2019-09-12 DIAGNOSIS — E669 Obesity, unspecified: Secondary | ICD-10-CM | POA: Diagnosis not present

## 2019-09-12 DIAGNOSIS — E559 Vitamin D deficiency, unspecified: Secondary | ICD-10-CM

## 2019-09-12 DIAGNOSIS — I10 Essential (primary) hypertension: Secondary | ICD-10-CM | POA: Diagnosis not present

## 2019-09-12 DIAGNOSIS — Z9189 Other specified personal risk factors, not elsewhere classified: Secondary | ICD-10-CM

## 2019-09-12 DIAGNOSIS — Z6830 Body mass index (BMI) 30.0-30.9, adult: Secondary | ICD-10-CM

## 2019-09-12 MED ORDER — VITAMIN D (ERGOCALCIFEROL) 1.25 MG (50000 UNIT) PO CAPS: 50000.0000 [IU] | ORAL_CAPSULE | ORAL | 0 refills | Status: DC

## 2019-09-18 NOTE — Progress Notes (Signed)
Chief Complaint:   OBESITY Morgan Mejia is here to discuss her progress with her obesity treatment plan along with follow-up of her obesity related diagnoses. Morgan Mejia is on the Category 2 Plan and keeping a food journal and adhering to recommended goals of 350-500 calories and 35+ grams of protein at supper daily and states she is following her eating plan approximately 75% of the time. Morgan Mejia states she is doing 0 minutes 0 times per week.  Today's visit was #: 6 Starting weight: 181 lbs Starting date: 10/17/2018 Today's weight: 180 lbs Today's date: 09/12/2019 Total lbs lost to date: 1 Total lbs lost since last in-office visit: 0  Interim History: Javanna did very well maintaining her weight over the holidays. She is ready to get back to weight loss efforts and has done well with journaling for dinner.  Subjective:   1. Vitamin D deficiency Morgan Mejia is stable on Vit D, but level is not yet at goal.  2. Essential hypertension Morgan Mejia's hypertension is worsening. She started hydrochlorothiazide at last visit, but her blood pressure is still elevated. She is not checking her blood pressure at home. She denies chest pain.  3. At risk for heart disease Morgan Mejia is at a higher than average risk for cardiovascular disease due to obesity. Reviewed: no chest pain on exertion, no dyspnea on exertion, and no swelling of ankles.  Assessment/Plan:   1. Vitamin D deficiency We will refill prescription Vit D for 1 month- Vitamin D, Ergocalciferol, (DRISDOL) 1.25 MG (50000 UT) CAPS capsule; Take 1 capsule (50,000 Units total) by mouth every 7 (seven) days. Dispense: 4 capsule; Refill: 0. We will continue to monitor.  2. Essential hypertension Morgan Mejia is working on healthy weight loss and exercise to improve blood pressure control. She is to continue taking hydrochlorothiazide and start checking her blood pressure daily. She is to bring in her blood pressure log to her next visit. We will  follow closely and will watch for signs of hypotension as she continues her lifestyle modifications.  3. At risk for heart disease Morgan Mejia was given approximately 15 minutes of coronary artery disease prevention counseling today. She is 49 y.o. female and has risk factors for heart disease including obesity. We discussed intensive lifestyle modifications today with an emphasis on specific weight loss instructions and strategies.   4. Class 1 obesity with serious comorbidity and body mass index (BMI) of 30.0 to 30.9 in adult, unspecified obesity type Morgan Mejia is currently in the action stage of change. As such, her goal is to continue with weight loss efforts. She has agreed to change to keeping a food journal and adhering to recommended goals of 1100-1300 calories and 75+ grams of protein daily.   We discussed the following exercise goals today: For substantial health benefits, adults should do at least 150 minutes (2 hours and 30 minutes) a week of moderate-intensity, or 75 minutes (1 hour and 15 minutes) a week of vigorous-intensity aerobic physical activity, or an equivalent combination of moderate- and vigorous-intensity aerobic activity. Aerobic activity should be performed in episodes of at least 10 minutes, and preferably, it should be spread throughout the week. Adults should also include muscle-strengthening activities that involve all major muscle groups on 2 or more days a week.  We discussed the following behavioral modification strategies today: meal planning and cooking strategies and keeping a strict food journal.  Morgan Mejia has agreed to follow-up with our clinic in 2 weeks. She was informed of the importance of frequent  follow-up visits to maximize her success with intensive lifestyle modifications for her multiple health conditions.   Objective:   Blood pressure (!) 149/80, pulse 79, temperature 98.4 F (36.9 C), temperature source Oral, height 5\' 5"  (1.651 m), weight 180 lb  (81.6 kg), SpO2 99 %. Body mass index is 29.95 kg/m.  General: Cooperative, alert, well developed, in no acute distress. HEENT: Conjunctivae and lids unremarkable. Neck: No thyromegaly.  Cardiovascular: Regular rhythm.  Lungs: Normal work of breathing. Extremities: No edema.  Neurologic: No focal deficits.   Lab Results  Component Value Date   CREATININE 0.76 09/12/2018   BUN 10 09/12/2018   NA 136 09/12/2018   K 4.2 09/12/2018   CL 104 09/12/2018   CO2 26 09/12/2018   Lab Results  Component Value Date   ALT 12 09/12/2018   AST 17 09/12/2018   ALKPHOS 40 09/12/2018   BILITOT 0.5 09/12/2018   Lab Results  Component Value Date   HGBA1C 5.3 10/17/2018   Lab Results  Component Value Date   INSULIN 8.2 10/17/2018   Lab Results  Component Value Date   TSH 2.40 09/12/2018   Lab Results  Component Value Date   CHOL 162 10/17/2018   HDL 52 10/17/2018   LDLCALC 96 10/17/2018   TRIG 70 10/17/2018   CHOLHDL 3 04/14/2018   Lab Results  Component Value Date   WBC 5.9 09/12/2018   HGB 13.5 09/12/2018   HCT 39.4 09/12/2018   MCV 92.6 09/12/2018   PLT 175.0 09/12/2018   No results found for: IRON, TIBC, FERRITIN  Attestation Statements:   Reviewed by clinician on day of visit: allergies, medications, problem list, medical history, surgical history, family history, social history, and previous encounter notes.   I, 11/11/2018, am acting as transcriptionist for Burt Knack, MD.  I have reviewed the above documentation for accuracy and completeness, and I agree with the above. -  Quillian Quince, MD

## 2019-09-24 ENCOUNTER — Encounter: Payer: Self-pay | Admitting: Obstetrics and Gynecology

## 2019-09-25 ENCOUNTER — Ambulatory Visit (INDEPENDENT_AMBULATORY_CARE_PROVIDER_SITE_OTHER): Payer: 59 | Admitting: Family Medicine

## 2019-09-26 ENCOUNTER — Other Ambulatory Visit: Payer: Self-pay

## 2019-09-26 ENCOUNTER — Ambulatory Visit (INDEPENDENT_AMBULATORY_CARE_PROVIDER_SITE_OTHER): Payer: 59 | Admitting: Family Medicine

## 2019-09-26 VITALS — BP 126/83 | HR 75 | Temp 98.5°F | Ht 65.0 in | Wt 180.0 lb

## 2019-09-26 DIAGNOSIS — F3289 Other specified depressive episodes: Secondary | ICD-10-CM

## 2019-09-26 DIAGNOSIS — E669 Obesity, unspecified: Secondary | ICD-10-CM | POA: Diagnosis not present

## 2019-09-26 DIAGNOSIS — Z9189 Other specified personal risk factors, not elsewhere classified: Secondary | ICD-10-CM | POA: Diagnosis not present

## 2019-09-26 DIAGNOSIS — Z6830 Body mass index (BMI) 30.0-30.9, adult: Secondary | ICD-10-CM

## 2019-09-26 DIAGNOSIS — I10 Essential (primary) hypertension: Secondary | ICD-10-CM

## 2019-09-27 MED ORDER — CHLORTHALIDONE 25 MG PO TABS: 25.0000 mg | ORAL_TABLET | ORAL | 0 refills | Status: DC

## 2019-09-27 MED ORDER — VORTIOXETINE HBR 20 MG PO TABS: 20.0000 mg | ORAL_TABLET | Freq: Every day | ORAL | 0 refills | Status: DC

## 2019-10-01 NOTE — Progress Notes (Signed)
Chief Complaint:   OBESITY Levana Minetti is here to discuss her progress with her obesity treatment plan along with follow-up of her obesity related diagnoses. Lametria is on keeping a food journal of 1100 to 1300 calories and 75+ grams of protein daily plan and states she is following her eating plan approximately 80% of the time. Magdelyn states she is exercising 0 minutes 0 times per week.  Today's visit was #: 7 Starting weight: 181 lbs Starting date: 10/17/2018 Today's weight: 180 lbs Today's date: 09/26/2019 Total lbs lost to date: 1 Total lbs lost since last in-office visit: 0  Interim History: Monzerrat feels she is plateauing and not losing much. She is getting in approximately 75 grams daily, most days. Nitza eats indulgently, and she drinks indulgently on the weekend with alcohol. She then plays "make up" by fasting for approximately 17 hours.  Subjective:   Essential hypertension - Plan: chlorthalidone (HYGROTON) 25 MG tablet Rex has worsening hypertension. Her blood pressure is controlled today. She denies chest pain, chest pressure or headache.  BP Readings from Last 3 Encounters:  09/26/19 126/83  09/12/19 (!) 149/80  08/08/19 (!) 147/83   Lab Results  Component Value Date   CREATININE 0.76 09/12/2018   CREATININE 0.81 04/14/2018   Other depression, with emotional eating (worsening) - Plan: vortioxetine HBr (TRINTELLIX) 20 MG TABS tablet Kenlyn is struggling with emotional eating and using food for comfort to the extent that it is negatively impacting her health. Lajuanna has some apathy and decreased interest. She has been working on behavior modification techniques to help reduce her emotional eating and has been somewhat successful. She shows no sign of suicidal or homicidal ideations.  At risk for heart disease Kavya is at a higher than average risk for cardiovascular disease due to obesity and hypertension. Reviewed: no chest pain on exertion, no  dyspnea on exertion, and no swelling of ankles.  Assessment/Plan:   Essential hypertension - Plan: chlorthalidone (HYGROTON) 25 MG tablet Megin is working on healthy weight loss and exercise to improve blood pressure control. Kamee agrees to stop HCTZ. She agrees to start Chlorthalidone 12.5 mg daily #30 with no refills. We will watch for signs of hypotension as she continues her lifestyle modifications.  Other depression, with emotional eating (worsening) - Plan: vortioxetine HBr (TRINTELLIX) 20 MG TABS tablet Behavior modification techniques were discussed today to help Sol deal with her emotional/non-hunger eating behaviors. Logyn agrees to take Trintellix 20 mg daily #90 with no refills. Orders and follow up as documented in patient record.   At risk for heart disease Kobie was given approximately 15 minutes of coronary artery disease prevention counseling today. She is 49 y.o. female and has risk factors for heart disease including obesity and hypertension. We discussed intensive lifestyle modifications today with an emphasis on specific weight loss instructions and strategies.   Repetitive spaced learning was employed today to elicit superior memory formation and behavioral change.  Class 1 obesity with serious comorbidity and body mass index (BMI) of 30.0 to 30.9 in adult, unspecified obesity type Raziya is currently in the action stage of change. As such, her goal is to continue with weight loss efforts. She has agreed to keeping a food journal and adhering to recommended goals of 1100 to 1300 calories and 75+ grams of protein daily.   Exercise goals: For substantial health benefits, adults should do at least 150 minutes (2 hours and 30 minutes) a week of moderate-intensity, or 75 minutes (1 hour  and 15 minutes) a week of vigorous-intensity aerobic physical activity, or an equivalent combination of moderate- and vigorous-intensity aerobic activity. Aerobic activity should be  performed in episodes of at least 10 minutes, and preferably, it should be spread throughout the week. Adults should also include muscle-strengthening activities that involve all major muscle groups on 2 or more days a week.  Behavioral modification strategies: increasing lean protein intake, increasing vegetables, meal planning and cooking strategies, keeping healthy foods in the home and planning for success.  Anam has agreed to follow-up with our clinic in 2 weeks. She was informed of the importance of frequent follow-up visits to maximize her success with intensive lifestyle modifications for her multiple health conditions. We will repeat indirect calorimetry at the next appointment.  Objective:   Blood pressure 126/83, pulse 75, temperature 98.5 F (36.9 C), temperature source Oral, height 5\' 5"  (1.651 m), weight 180 lb (81.6 kg), SpO2 96 %. Body mass index is 29.95 kg/m.  General: Cooperative, alert, well developed, in no acute distress. HEENT: Conjunctivae and lids unremarkable. Cardiovascular: Regular rhythm.  Lungs: Normal work of breathing. Neurologic: No focal deficits.   Lab Results  Component Value Date   CREATININE 0.76 09/12/2018   BUN 10 09/12/2018   NA 136 09/12/2018   K 4.2 09/12/2018   CL 104 09/12/2018   CO2 26 09/12/2018   Lab Results  Component Value Date   ALT 12 09/12/2018   AST 17 09/12/2018   ALKPHOS 40 09/12/2018   BILITOT 0.5 09/12/2018   Lab Results  Component Value Date   HGBA1C 5.3 10/17/2018   Lab Results  Component Value Date   INSULIN 8.2 10/17/2018   Lab Results  Component Value Date   TSH 2.40 09/12/2018   Lab Results  Component Value Date   CHOL 162 10/17/2018   HDL 52 10/17/2018   LDLCALC 96 10/17/2018   TRIG 70 10/17/2018   CHOLHDL 3 04/14/2018   Lab Results  Component Value Date   WBC 5.9 09/12/2018   HGB 13.5 09/12/2018   HCT 39.4 09/12/2018   MCV 92.6 09/12/2018   PLT 175.0 09/12/2018   No results found for:  IRON, TIBC, FERRITIN   Ref. Range 10/17/2018 10:06  Vitamin D, 25-Hydroxy Latest Ref Range: 30.0 - 100.0 ng/mL 34.7    Attestation Statements:   Reviewed by clinician on day of visit: allergies, medications, problem list, medical history, surgical history, family history, social history, and previous encounter notes.  I, 12/16/2018, am acting as transcriptionist for [place] Crane, MD.  I have reviewed the above documentation for accuracy and completeness, and I agree with the above. Filbert Schilder, MD

## 2019-10-02 ENCOUNTER — Other Ambulatory Visit (INDEPENDENT_AMBULATORY_CARE_PROVIDER_SITE_OTHER): Payer: Self-pay | Admitting: Family Medicine

## 2019-10-02 DIAGNOSIS — I10 Essential (primary) hypertension: Secondary | ICD-10-CM

## 2019-10-05 ENCOUNTER — Other Ambulatory Visit (INDEPENDENT_AMBULATORY_CARE_PROVIDER_SITE_OTHER): Payer: Self-pay | Admitting: Family Medicine

## 2019-10-05 DIAGNOSIS — E559 Vitamin D deficiency, unspecified: Secondary | ICD-10-CM

## 2019-10-11 ENCOUNTER — Ambulatory Visit (INDEPENDENT_AMBULATORY_CARE_PROVIDER_SITE_OTHER): Payer: 59 | Admitting: Family Medicine

## 2019-10-11 ENCOUNTER — Encounter (INDEPENDENT_AMBULATORY_CARE_PROVIDER_SITE_OTHER): Payer: Self-pay | Admitting: Family Medicine

## 2019-10-11 ENCOUNTER — Other Ambulatory Visit: Payer: Self-pay

## 2019-10-11 VITALS — BP 125/82 | HR 79 | Temp 98.3°F | Ht 65.0 in | Wt 179.0 lb

## 2019-10-11 DIAGNOSIS — E559 Vitamin D deficiency, unspecified: Secondary | ICD-10-CM | POA: Diagnosis not present

## 2019-10-11 DIAGNOSIS — R5383 Other fatigue: Secondary | ICD-10-CM

## 2019-10-11 DIAGNOSIS — E669 Obesity, unspecified: Secondary | ICD-10-CM

## 2019-10-11 DIAGNOSIS — R0602 Shortness of breath: Secondary | ICD-10-CM

## 2019-10-11 DIAGNOSIS — E538 Deficiency of other specified B group vitamins: Secondary | ICD-10-CM | POA: Diagnosis not present

## 2019-10-11 DIAGNOSIS — Z9189 Other specified personal risk factors, not elsewhere classified: Secondary | ICD-10-CM

## 2019-10-11 DIAGNOSIS — Z6830 Body mass index (BMI) 30.0-30.9, adult: Secondary | ICD-10-CM

## 2019-10-11 MED ORDER — VITAMIN D (ERGOCALCIFEROL) 1.25 MG (50000 UNIT) PO CAPS: 50000.0000 [IU] | ORAL_CAPSULE | ORAL | 0 refills | Status: DC

## 2019-10-11 NOTE — Progress Notes (Signed)
Chief Complaint:   OBESITY Morgan Mejia is here to discuss her progress with her obesity treatment plan along with follow-up of her obesity related diagnoses. Morgan Mejia is on keeping a food journal and adhering to recommended goals of 1100 to 1300 calories and 75 grams of protein daily and states she is following her eating plan approximately 80% of the time. Morgan Mejia states she is exercising 0 minutes 0 times per week.  Today's visit was #: 8 Starting weight: 181 lbs Starting date: 10/17/2018 Today's weight: 179 lbs Today's date: 10/11/2019 Total lbs lost to date: 2 Total lbs lost since last in-office visit: 1  Interim History: [name] voices she has been following the plan as strictly as she can the last few weeks. She does anticipate possibly indulging for Super Bowl Sunday. Morgan Mejia has no hunger.  Subjective:   SOB (shortness of breath) on exertion Morgan Mejia has had a change since her initial appointment. She has started exercising. Indirect calorimetry ordered today shows VO2 of 308 and RMR of 2137 (RMR improved from 1355 10/17/18).   Other fatigue There has been a change since Morgan Mejia's last appointment. She has started exercising.  Vitamin B 12 deficiency Lab result from one year ago shows B12 of 209 and recent B12 result of 705. She is on supplementation.  Lab Results  Component Value Date   VITAMINB12 705 09/12/2018    Vitamin D deficiency Morgan Mejia Vitamin D level was 34.7 on 10/17/18. She is on prescription vit D. She admits fatigue and denies nausea, vomiting or muscle weakness.  At risk for osteoporosis Morgan Mejia is at higher risk of osteopenia and osteoporosis due to Vitamin D deficiency.   Assessment/Plan:   SOB (shortness of breath) on exertion We will order indirect calorimetry today.  Other fatigue Indirect calorimetry will be ordered today.  Vitamin B 12 deficiency Morgan Mejia will continue B12 supplementation daily.  Counseling . The body needs vitamin  B12: to make red blood cells; to make DNA; and to help the nerves work properly so they can carry messages from the brain to the body.  . The main causes of vitamin B12 deficiency include dietary deficiency, digestive diseases, pernicious anemia, and having a surgery in which part of the stomach or small intestine is removed.  . Certain medicines can make it harder for the body to absorb vitamin B12. These medicines include: heartburn medications; some antibiotics; some medications used to treat diabetes, gout, and high cholesterol.  . In some cases, there are no symptoms of this condition. If the condition leads to anemia or nerve damage, various symptoms can occur, such as weakness or fatigue, shortness of breath, and numbness or tingling in your hands and feet.   . Treatment:  o May include taking vitamin B12 supplements.  o Avoid alcohol.  o Eat lots of healthy foods that contain vitamin B12: - Beef, pork, chicken, [place], and organ meats, such as liver.  - Seafood: This includes clams, rainbow trout, salmon, tuna, and haddock. Eggs.  - Cereal and dairy products that are fortified: This means that vitamin B12 has been added to the food.   Vitamin D deficiency  Low Vitamin D level contributes to fatigue and are associated with obesity, breast, and colon cancer. Morgan Mejia agrees to continue to take prescription Vitamin D @50 ,000 IU every week #4 with no refills and she will follow-up for routine testing of Vitamin D, at least 2-3 times per year to avoid over-replacement.  At risk for osteoporosis Morgan Mejia was given  approximately 15 minutes of osteoporosis prevention counseling today. Morgan Mejia is at risk for osteopenia and osteoporosis due to her Vitamin D deficiency. She was encouraged to take her Vitamin D and follow her higher calcium diet and increase strengthening exercise to help strengthen her bones and decrease her risk of osteopenia and osteoporosis.  Repetitive spaced learning was employed  today to elicit superior memory formation and behavioral change. Class 1 obesity with serious comorbidity and body mass index (BMI) of 30.0 to 30.9 in adult, unspecified obesity type - BMI greater than 30 at start of program  Morgan Mejia is currently in the action stage of change. As such, her goal is to continue with weight loss efforts. She has agreed to the Category 3 Plan.   Exercise goals: Morgan Mejia will start Pel[employer] for 15 minute ride, 3 times per week.  Behavioral modification strategies: increasing lean protein intake, increasing vegetables, meal planning and cooking strategies, keeping healthy foods in the home and planning for success.  Morgan Mejia has agreed to follow-up with our clinic in 2 weeks. She was informed of the importance of frequent follow-up visits to maximize her success with intensive lifestyle modifications for her multiple health conditions.   Objective:   Blood pressure 125/82, pulse 79, temperature 98.3 F (36.8 C), temperature source Oral, height 5\' 5"  (1.651 m), weight 179 lb (81.2 kg), SpO2 98 %. Body mass index is 29.79 kg/m.  General: Cooperative, alert, well developed, in no acute distress. HEENT: Conjunctivae and lids unremarkable. Cardiovascular: Regular rhythm.  Lungs: Normal work of breathing. Neurologic: No focal deficits.   Lab Results  Component Value Date   CREATININE 0.76 09/12/2018   BUN 10 09/12/2018   NA 136 09/12/2018   K 4.2 09/12/2018   CL 104 09/12/2018   CO2 26 09/12/2018   Lab Results  Component Value Date   ALT 12 09/12/2018   AST 17 09/12/2018   ALKPHOS 40 09/12/2018   BILITOT 0.5 09/12/2018   Lab Results  Component Value Date   HGBA1C 5.3 10/17/2018   Lab Results  Component Value Date   INSULIN 8.2 10/17/2018   Lab Results  Component Value Date   TSH 2.40 09/12/2018   Lab Results  Component Value Date   CHOL 162 10/17/2018   HDL 52 10/17/2018   LDLCALC 96 10/17/2018   TRIG 70 10/17/2018   CHOLHDL 3 04/14/2018    Lab Results  Component Value Date   WBC 5.9 09/12/2018   HGB 13.5 09/12/2018   HCT 39.4 09/12/2018   MCV 92.6 09/12/2018   PLT 175.0 09/12/2018   No results found for: IRON, TIBC, FERRITIN   Ref. Range 10/17/2018 10:06  Vitamin D, 25-Hydroxy Latest Ref Range: 30.0 - 100.0 ng/mL 34.7    Attestation Statements:   Reviewed by clinician on day of visit: allergies, medications, problem list, medical history, surgical history, family history, social history, and previous encounter notes.  I, Doreene Nest, am acting as transcriptionist for Eber Jones, MD.  I have reviewed the above documentation for accuracy and completeness, and I agree with the above. - Ilene Qua, MD

## 2019-10-19 ENCOUNTER — Other Ambulatory Visit (INDEPENDENT_AMBULATORY_CARE_PROVIDER_SITE_OTHER): Payer: Self-pay | Admitting: Family Medicine

## 2019-10-19 DIAGNOSIS — I10 Essential (primary) hypertension: Secondary | ICD-10-CM

## 2019-10-30 ENCOUNTER — Ambulatory Visit (INDEPENDENT_AMBULATORY_CARE_PROVIDER_SITE_OTHER): Payer: 59 | Admitting: Family Medicine

## 2019-10-30 ENCOUNTER — Other Ambulatory Visit: Payer: Self-pay

## 2019-10-30 ENCOUNTER — Encounter (INDEPENDENT_AMBULATORY_CARE_PROVIDER_SITE_OTHER): Payer: Self-pay | Admitting: Family Medicine

## 2019-10-30 VITALS — BP 132/83 | HR 71 | Temp 98.1°F | Ht 65.0 in | Wt 179.0 lb

## 2019-10-30 DIAGNOSIS — E038 Other specified hypothyroidism: Secondary | ICD-10-CM | POA: Diagnosis not present

## 2019-10-30 DIAGNOSIS — Z9189 Other specified personal risk factors, not elsewhere classified: Secondary | ICD-10-CM

## 2019-10-30 DIAGNOSIS — E669 Obesity, unspecified: Secondary | ICD-10-CM | POA: Diagnosis not present

## 2019-10-30 DIAGNOSIS — E559 Vitamin D deficiency, unspecified: Secondary | ICD-10-CM | POA: Diagnosis not present

## 2019-10-30 DIAGNOSIS — Z6830 Body mass index (BMI) 30.0-30.9, adult: Secondary | ICD-10-CM

## 2019-10-30 MED ORDER — VITAMIN D (ERGOCALCIFEROL) 1.25 MG (50000 UNIT) PO CAPS: 50000.0000 [IU] | ORAL_CAPSULE | ORAL | 0 refills | Status: DC

## 2019-10-30 NOTE — Progress Notes (Signed)
Chief Complaint:   OBESITY Morgan Mejia is here to discuss her progress with her obesity treatment plan along with follow-up of her obesity related diagnoses. Morgan Mejia is on the Category 3 Plan and states she is following her eating plan approximately 80 to 85% of the time. Morgan Mejia states she is riding the stationary bike 20 minutes 3 times per week.  Today's visit was #: 9 Starting weight: 181 lbs Starting date: 10/17/2018 Today's weight: 179 lbs Today's date: 10/30/2019 Total lbs lost to date: 2 Total lbs lost since last in-office visit: 0  Interim History: The last few weeks were fairly on track. The weather for the past few weeks, has decreased motivation to not emotional eat. Morgan Mejia voices that the weekends, she "falls off the wagon", and eats out. She does think she's getting approximately 75 grams of protein for the day.  Subjective:   Vitamin D deficiency  Morgan Mejia's Vitamin D level was 34.7 on 10/17/18. She is on prescription vit D. She admits fatigue and denies nausea, vomiting or muscle weakness.  Other specified hypothyroidism Morgan Mejia admits fatigue and denies heat / cold intolerance or palpitations.   Lab Results  Component Value Date   TSH 2.40 09/12/2018   At risk for osteoporosis Morgan Mejia is at higher risk of osteopenia and osteoporosis due to Vitamin D deficiency.   Assessment/Plan:   Vitamin D deficiency  Low Vitamin D level contributes to fatigue and are associated with obesity, breast, and colon cancer. Morgan Mejia agrees to continue to take prescription Vitamin D @50 ,000 IU every week #4 with no refills and she will follow-up for routine testing of Vitamin D, at least 2-3 times per year to avoid over-replacement.  Other specified hypothyroidism Morgan Mejia will continue levothyroxine 137 mcg PO qAM. Orders and follow up as documented in patient record.  At risk for osteoporosis  Morgan Mejia was given approximately 15 minutes of osteoporosis prevention counseling  today. Morgan Mejia is at risk for osteopenia and osteoporosis due to her Vitamin D deficiency. She was encouraged to take her Vitamin D and follow her higher calcium diet and increase strengthening exercise to help strengthen her bones and decrease her risk of osteopenia and osteoporosis.  Repetitive spaced learning was employed today to elicit superior memory formation and behavioral change.  Class 1 obesity with serious comorbidity and body mass index (BMI) of 30.0 to 30.9 in adult, unspecified obesity type - BMI greater than 30 at start of program  Morgan Mejia is currently in the action stage of change. As such, her goal is to continue with weight loss efforts. She has agreed to the Category 3 Plan and keeping a food journal and adhering to recommended goals of 1450 to 1600 calories and 90+ grams of protein daily.   Behavioral modification strategies: increasing lean protein intake, increasing vegetables, meal planning and cooking strategies, keeping healthy foods in the home and planning for success.  We will discuss Saxenda at the next appointment.  Morgan Mejia has agreed to follow-up with our clinic in 2 weeks. She was informed of the importance of frequent follow-up visits to maximize her success with intensive lifestyle modifications for her multiple health conditions.   Objective:   Blood pressure 132/83, pulse 71, temperature 98.1 F (36.7 C), temperature source Oral, height 5\' 5"  (1.651 m), weight 179 lb (81.2 kg), SpO2 98 %. Body mass index is 29.79 kg/m.  General: Cooperative, alert, well developed, in no acute distress. HEENT: Conjunctivae and lids unremarkable. Cardiovascular: Regular rhythm.  Lungs: Normal work of  breathing. Neurologic: No focal deficits.   Lab Results  Component Value Date   CREATININE 0.76 09/12/2018   BUN 10 09/12/2018   NA 136 09/12/2018   K 4.2 09/12/2018   CL 104 09/12/2018   CO2 26 09/12/2018   Lab Results  Component Value Date   ALT 12 09/12/2018    AST 17 09/12/2018   ALKPHOS 40 09/12/2018   BILITOT 0.5 09/12/2018   Lab Results  Component Value Date   HGBA1C 5.3 10/17/2018   Lab Results  Component Value Date   INSULIN 8.2 10/17/2018   Lab Results  Component Value Date   TSH 2.40 09/12/2018   Lab Results  Component Value Date   CHOL 162 10/17/2018   HDL 52 10/17/2018   LDLCALC 96 10/17/2018   TRIG 70 10/17/2018   CHOLHDL 3 04/14/2018   Lab Results  Component Value Date   WBC 5.9 09/12/2018   HGB 13.5 09/12/2018   HCT 39.4 09/12/2018   MCV 92.6 09/12/2018   PLT 175.0 09/12/2018   No results found for: IRON, TIBC, FERRITIN   Ref. Range 10/17/2018 10:06  Vitamin D, 25-Hydroxy Latest Ref Range: 30.0 - 100.0 ng/mL 34.7    Attestation Statements:   Reviewed by clinician on day of visit: allergies, medications, problem list, medical history, surgical history, family history, social history, and previous encounter notes.  I, Doreene Nest, am acting as transcriptionist for Coralie Common, MD.  I have reviewed the above documentation for accuracy and completeness, and I agree with the above. - Ilene Qua, MD

## 2019-11-13 ENCOUNTER — Encounter (INDEPENDENT_AMBULATORY_CARE_PROVIDER_SITE_OTHER): Payer: Self-pay | Admitting: Family Medicine

## 2019-11-13 ENCOUNTER — Ambulatory Visit (INDEPENDENT_AMBULATORY_CARE_PROVIDER_SITE_OTHER): Payer: 59 | Admitting: Family Medicine

## 2019-11-13 ENCOUNTER — Other Ambulatory Visit: Payer: Self-pay

## 2019-11-13 VITALS — BP 156/94 | HR 76 | Temp 98.6°F | Ht 65.0 in | Wt 180.0 lb

## 2019-11-13 DIAGNOSIS — Z6830 Body mass index (BMI) 30.0-30.9, adult: Secondary | ICD-10-CM | POA: Diagnosis not present

## 2019-11-13 DIAGNOSIS — E669 Obesity, unspecified: Secondary | ICD-10-CM | POA: Diagnosis not present

## 2019-11-13 DIAGNOSIS — I10 Essential (primary) hypertension: Secondary | ICD-10-CM

## 2019-11-13 DIAGNOSIS — E559 Vitamin D deficiency, unspecified: Secondary | ICD-10-CM

## 2019-11-13 NOTE — Progress Notes (Signed)
Chief Complaint:   OBESITY Morgan Mejia is here to discuss her progress with her obesity treatment plan along with follow-up of her obesity related diagnoses. Morgan Mejia is keeping a food journal of 1450 to 1600 calories and 90+ grams of protein daily and states she is following her eating plan approximately 60% of the time. Morgan Mejia states she is walking 30 minutes 3 times per week.  Today's visit was #: 10 Starting weight: 181 lbs Starting date: 10/17/2018 Today's weight: 180 lbs Today's date: 11/13/2019 Total lbs lost to date: 1 Total lbs lost since last in-office visit: 0  Interim History: Morgan Mejia voices that she hasn't done so well, the past few weeks. She has increased the exercise she was doing, but she has struggled to journal all day. She is often not journaling for the whole day and she is missing dinner.   Subjective:   Essential hypertension Morgan Mejia's blood pressure is elevated today (156/92). Previously her blood pressure was controlled. She denies chest pain, chest pressure or headache.  BP Readings from Last 3 Encounters:  11/13/19 (!) 156/94  10/30/19 132/83  10/11/19 125/82   Lab Results  Component Value Date   CREATININE 0.76 09/12/2018   CREATININE 0.81 04/14/2018   Vitamin D deficiency Morgan Mejia's Vitamin D level was 34.7 on 10/17/18. She is currently taking prescription vit D. She admits fatigue and denies nausea, vomiting or muscle weakness.  Assessment/Plan:   Essential hypertension Morgan Mejia is working on healthy weight loss and exercise to improve blood pressure control. She will continue Chlorthalidone and we will follow blood pressure at the next appointment. We will watch for signs of hypotension as she continues her lifestyle modifications.  Vitamin D deficiency Low Vitamin D level contributes to fatigue and are associated with obesity, breast, and colon cancer. [name] will continue to take prescription Vitamin D @50 ,000 IU every week (no refill  needed) and she will follow-up for routine testing of Vitamin D, at least 2-3 times per year to avoid over-replacement.  Class 1 obesity with serious comorbidity and body mass index (BMI) of 30.0 to 30.9 in adult, unspecified obesity type Morgan Mejia is currently in the action stage of change. As such, her goal is to continue with weight loss efforts. She has agreed to the Category 3 Plan.   Exercise goals: Morgan Mejia will continue her current exercise regimen.  Behavioral modification strategies: increasing lean protein intake, increasing vegetables, meal planning and cooking strategies, keeping healthy foods in the home and keeping a strict food journal.  Morgan Mejia has agreed to follow-up with our clinic in 2 weeks. She was informed of the importance of frequent follow-up visits to maximize her success with intensive lifestyle modifications for her multiple health conditions.   Objective:   Blood pressure (!) 156/94, pulse 76, temperature 98.6 F (37 C), temperature source Oral, height 5\' 5"  (1.651 m), weight 180 lb (81.6 kg), SpO2 97 %. Body mass index is 29.95 kg/m.  General: Cooperative, alert, well developed, in no acute distress. HEENT: Conjunctivae and lids unremarkable. Cardiovascular: Regular rhythm.  Lungs: Normal work of breathing. Neurologic: No focal deficits.   Lab Results  Component Value Date   CREATININE 0.76 09/12/2018   BUN 10 09/12/2018   NA 136 09/12/2018   K 4.2 09/12/2018   CL 104 09/12/2018   CO2 26 09/12/2018   Lab Results  Component Value Date   ALT 12 09/12/2018   AST 17 09/12/2018   ALKPHOS 40 09/12/2018   BILITOT 0.5 09/12/2018  Lab Results  Component Value Date   HGBA1C 5.3 10/17/2018   Lab Results  Component Value Date   INSULIN 8.2 10/17/2018   Lab Results  Component Value Date   TSH 2.40 09/12/2018   Lab Results  Component Value Date   CHOL 162 10/17/2018   HDL 52 10/17/2018   LDLCALC 96 10/17/2018   TRIG 70 10/17/2018   CHOLHDL 3  04/14/2018   Lab Results  Component Value Date   WBC 5.9 09/12/2018   HGB 13.5 09/12/2018   HCT 39.4 09/12/2018   MCV 92.6 09/12/2018   PLT 175.0 09/12/2018   No results found for: IRON, TIBC, FERRITIN   Ref. Range 10/17/2018 10:06  Vitamin D, 25-Hydroxy Latest Ref Range: 30.0 - 100.0 ng/mL 34.7    Attestation Statements:   Reviewed by clinician on day of visit: allergies, medications, problem list, medical history, surgical history, family history, social history, and previous encounter notes.  Time spent on visit including pre-visit chart review and post-visit charting and care was 15 minutes.   I, Nevada Crane, am acting as transcriptionist for Reuben Likes, MD.  I have reviewed the above documentation for accuracy and completeness, and I agree with the above. - Debbra Riding, MD

## 2019-11-17 ENCOUNTER — Ambulatory Visit: Payer: 59 | Attending: Internal Medicine

## 2019-11-17 DIAGNOSIS — Z23 Encounter for immunization: Secondary | ICD-10-CM

## 2019-11-17 NOTE — Progress Notes (Signed)
   Covid-19 Vaccination Clinic  Name:  Morgan Mejia    MRN: [id number] DOB: Jul 05, 1971  11/17/2019  [name] was observed post Covid-19 immunization for 30 minutes based on pre-vaccination screening without incident. She was provided with Vaccine Information Sheet and instruction to access the V-Safe system.   [name] was instructed to call 911 with any severe reactions post vaccine: Marland Kitchen Difficulty breathing  . Swelling of face and throat  . A fast heartbeat  . A bad rash all over body  . Dizziness and weakness   Immunizations Administered    Name Date Dose VIS Date Route   Pfizer COVID-19 Vaccine 11/17/2019 12:13 PM 0.3 mL 08/17/2019 Intramuscular   Manufacturer: [employer], [employer]   Lot: [id number]   NDC: [address]-8

## 2019-11-25 ENCOUNTER — Other Ambulatory Visit (INDEPENDENT_AMBULATORY_CARE_PROVIDER_SITE_OTHER): Payer: Self-pay | Admitting: Family Medicine

## 2019-11-25 DIAGNOSIS — I10 Essential (primary) hypertension: Secondary | ICD-10-CM

## 2019-11-28 ENCOUNTER — Other Ambulatory Visit: Payer: Self-pay

## 2019-11-28 ENCOUNTER — Ambulatory Visit (INDEPENDENT_AMBULATORY_CARE_PROVIDER_SITE_OTHER): Payer: 59 | Admitting: Family Medicine

## 2019-11-28 ENCOUNTER — Encounter (INDEPENDENT_AMBULATORY_CARE_PROVIDER_SITE_OTHER): Payer: Self-pay | Admitting: Family Medicine

## 2019-11-28 VITALS — BP 125/81 | HR 75 | Temp 98.3°F | Ht 65.0 in | Wt 180.0 lb

## 2019-11-28 DIAGNOSIS — I10 Essential (primary) hypertension: Secondary | ICD-10-CM | POA: Diagnosis not present

## 2019-11-28 DIAGNOSIS — E559 Vitamin D deficiency, unspecified: Secondary | ICD-10-CM

## 2019-11-28 DIAGNOSIS — Z6830 Body mass index (BMI) 30.0-30.9, adult: Secondary | ICD-10-CM

## 2019-11-28 DIAGNOSIS — E669 Obesity, unspecified: Secondary | ICD-10-CM

## 2019-11-28 DIAGNOSIS — Z9189 Other specified personal risk factors, not elsewhere classified: Secondary | ICD-10-CM | POA: Diagnosis not present

## 2019-11-28 MED ORDER — VITAMIN D (ERGOCALCIFEROL) 1.25 MG (50000 UNIT) PO CAPS: 50000.0000 [IU] | ORAL_CAPSULE | ORAL | 0 refills | Status: DC

## 2019-11-28 NOTE — Progress Notes (Signed)
Chief Complaint:   OBESITY Morgan Mejia is here to discuss her progress with her obesity treatment plan along with follow-up of her obesity related diagnoses. Morgan Mejia is on the Category 3 Plan and states she is following her eating plan approximately 70% of the time. Morgan Mejia states she is walking 30 minutes 2 times per week.  Today's visit was #: 11 Starting weight: 181 lbs Starting date: 10/17/2018 Today's weight: 180 lbs Today's date: 11/28/2019 Total lbs lost to date: 1 Total lbs lost since last in-office visit: 0  Interim History: Morgan Mejia felt she really got back on track 3 days ago. She did the yogurt option for breakfast; sandwich or salad option for lunch; and grilled chicken with vegetable and some potatoes for dinner. She reports ordering out 3 days a week and eating indulgently.  Subjective:   Vitamin D deficiency. No nausea, vomiting, or muscle weakness. She endorses fatigue. She is on prescription Vitamin D. Last Vitamin D 34.7 on 10/17/2018.  Essential hypertension. Blood pressure is well controlled today. No chest pain, chest pressure, or headache.  BP Readings from Last 3 Encounters:  11/28/19 125/81  11/13/19 (!) 156/94  10/30/19 132/83   Lab Results  Component Value Date   CREATININE 0.76 09/12/2018   CREATININE 0.81 04/14/2018   At risk for osteoporosis. Morgan Mejia is at higher risk of osteopenia and osteoporosis due to Vitamin D deficiency.   Assessment/Plan:   Vitamin D deficiency. Low Vitamin D level contributes to fatigue and are associated with obesity, breast, and colon cancer. She was given a refill on her Vitamin D, Ergocalciferol, (DRISDOL) 1.25 MG (50000 UNIT) CAPS capsule every week #4 with 0 refills and will follow-up for routine testing of Vitamin D, at least 2-3 times per year to avoid over-replacement.     Essential hypertension. Morgan Mejia is working on healthy weight loss and exercise to improve blood pressure control. We will watch for  signs of hypotension as she continues her lifestyle modifications. Morgan Mejia will continue her current medications at 1/2 pill.  At risk for osteoporosis. Morgan Mejia was given approximately 15 minutes of osteoporosis prevention counseling today. Morgan Mejia is at risk for osteopenia and osteoporosis due to her Vitamin D deficiency. She was encouraged to take her Vitamin D and follow her higher calcium diet and increase strengthening exercise to help strengthen her bones and decrease her risk of osteopenia and osteoporosis.  Repetitive spaced learning was employed today to elicit superior memory formation and behavioral change.  Class 1 obesity with serious comorbidity and body mass index (BMI) of 30.0 to 30.9 in adult, unspecified obesity type - BMI greater than 30 at start of program.  Morgan Mejia is currently in the action stage of change. As such, her goal is to continue with weight loss efforts. She has agreed to the Category 3 Plan.   Exercise goals: Morgan Mejia will continue her current exercise regimen.  Behavioral modification strategies: increasing lean protein intake, increasing vegetables, meal planning and cooking strategies and travel eating strategies.  Morgan Mejia has agreed to follow-up with our clinic in 2-3 weeks. She was informed of the importance of frequent follow-up visits to maximize her success with intensive lifestyle modifications for her multiple health conditions.   Objective:   Blood pressure 125/81, pulse 75, temperature 98.3 F (36.8 C), temperature source Oral, height 5\' 5"  (1.651 m), weight 180 lb (81.6 kg), SpO2 94 %. Body mass index is 29.95 kg/m.  General: Cooperative, alert, well developed, in no acute distress. HEENT: Conjunctivae and lids  unremarkable. Cardiovascular: Regular rhythm.  Lungs: Normal work of breathing. Neurologic: No focal deficits.   Lab Results  Component Value Date   CREATININE 0.76 09/12/2018   BUN 10 09/12/2018   NA 136 09/12/2018   K 4.2  09/12/2018   CL 104 09/12/2018   CO2 26 09/12/2018   Lab Results  Component Value Date   ALT 12 09/12/2018   AST 17 09/12/2018   ALKPHOS 40 09/12/2018   BILITOT 0.5 09/12/2018   Lab Results  Component Value Date   HGBA1C 5.3 10/17/2018   Lab Results  Component Value Date   INSULIN 8.2 10/17/2018   Lab Results  Component Value Date   TSH 2.40 09/12/2018   Lab Results  Component Value Date   CHOL 162 10/17/2018   HDL 52 10/17/2018   LDLCALC 96 10/17/2018   TRIG 70 10/17/2018   CHOLHDL 3 04/14/2018   Lab Results  Component Value Date   WBC 5.9 09/12/2018   HGB 13.5 09/12/2018   HCT 39.4 09/12/2018   MCV 92.6 09/12/2018   PLT 175.0 09/12/2018   No results found for: IRON, TIBC, FERRITIN  Attestation Statements:   Reviewed by clinician on day of visit: allergies, medications, problem list, medical history, surgical history, family history, social history, and previous encounter notes.  I, Marianna Payment, am acting as transcriptionist for Reuben Likes, MD   I have reviewed the above documentation for accuracy and completeness, and I agree with the above. - Debbra Riding, MD

## 2019-12-11 ENCOUNTER — Ambulatory Visit: Payer: Self-pay | Attending: Internal Medicine

## 2019-12-11 DIAGNOSIS — Z23 Encounter for immunization: Secondary | ICD-10-CM

## 2019-12-11 NOTE — Progress Notes (Signed)
   Covid-19 Vaccination Clinic  Name:  Morgan Mejia    MRN: [id number] DOB: 28-Sep-1970  12/11/2019  [name] was observed post Covid-19 immunization for 30 minutes based on pre-vaccination screening without incident. She was provided with Vaccine Information Sheet and instruction to access the V-Safe system.   [name] was instructed to call 911 with any severe reactions post vaccine: Marland Kitchen Difficulty breathing  . Swelling of face and throat  . A fast heartbeat  . A bad rash all over body  . Dizziness and weakness   Immunizations Administered    Name Date Dose VIS Date Route   Pfizer COVID-19 Vaccine 12/11/2019 11:42 AM 0.3 mL 08/17/2019 Intramuscular   Manufacturer: [employer], [employer]   Lot: [id number]   NDC: [address]-0

## 2019-12-13 ENCOUNTER — Ambulatory Visit (INDEPENDENT_AMBULATORY_CARE_PROVIDER_SITE_OTHER): Payer: 59 | Admitting: Family Medicine

## 2019-12-16 ENCOUNTER — Other Ambulatory Visit (INDEPENDENT_AMBULATORY_CARE_PROVIDER_SITE_OTHER): Payer: Self-pay | Admitting: Family Medicine

## 2019-12-16 DIAGNOSIS — F3289 Other specified depressive episodes: Secondary | ICD-10-CM

## 2019-12-24 ENCOUNTER — Other Ambulatory Visit (INDEPENDENT_AMBULATORY_CARE_PROVIDER_SITE_OTHER): Payer: Self-pay | Admitting: Family Medicine

## 2019-12-24 DIAGNOSIS — I10 Essential (primary) hypertension: Secondary | ICD-10-CM

## 2019-12-24 MED ORDER — CHLORTHALIDONE 25 MG PO TABS: 25.0000 mg | ORAL_TABLET | Freq: Every day | ORAL | 0 refills | Status: DC

## 2019-12-26 ENCOUNTER — Ambulatory Visit (INDEPENDENT_AMBULATORY_CARE_PROVIDER_SITE_OTHER): Payer: 59 | Admitting: Family Medicine

## 2019-12-29 ENCOUNTER — Other Ambulatory Visit (INDEPENDENT_AMBULATORY_CARE_PROVIDER_SITE_OTHER): Payer: Self-pay | Admitting: Family Medicine

## 2019-12-29 DIAGNOSIS — F3289 Other specified depressive episodes: Secondary | ICD-10-CM

## 2019-12-31 ENCOUNTER — Other Ambulatory Visit (INDEPENDENT_AMBULATORY_CARE_PROVIDER_SITE_OTHER): Payer: Self-pay | Admitting: Family Medicine

## 2019-12-31 DIAGNOSIS — F3289 Other specified depressive episodes: Secondary | ICD-10-CM

## 2020-01-02 ENCOUNTER — Other Ambulatory Visit: Payer: Self-pay

## 2020-01-02 ENCOUNTER — Ambulatory Visit (INDEPENDENT_AMBULATORY_CARE_PROVIDER_SITE_OTHER): Payer: 59 | Admitting: Family Medicine

## 2020-01-02 ENCOUNTER — Encounter (INDEPENDENT_AMBULATORY_CARE_PROVIDER_SITE_OTHER): Payer: Self-pay | Admitting: Family Medicine

## 2020-01-02 VITALS — BP 147/89 | HR 70 | Temp 98.0°F | Ht 65.0 in | Wt 180.0 lb

## 2020-01-02 DIAGNOSIS — E8881 Metabolic syndrome: Secondary | ICD-10-CM | POA: Diagnosis not present

## 2020-01-02 DIAGNOSIS — E559 Vitamin D deficiency, unspecified: Secondary | ICD-10-CM

## 2020-01-02 DIAGNOSIS — E669 Obesity, unspecified: Secondary | ICD-10-CM

## 2020-01-02 DIAGNOSIS — Z9189 Other specified personal risk factors, not elsewhere classified: Secondary | ICD-10-CM | POA: Diagnosis not present

## 2020-01-02 DIAGNOSIS — Z6830 Body mass index (BMI) 30.0-30.9, adult: Secondary | ICD-10-CM

## 2020-01-02 DIAGNOSIS — F3289 Other specified depressive episodes: Secondary | ICD-10-CM | POA: Diagnosis not present

## 2020-01-02 MED ORDER — VITAMIN D (ERGOCALCIFEROL) 1.25 MG (50000 UNIT) PO CAPS: 50000.0000 [IU] | ORAL_CAPSULE | ORAL | 0 refills | Status: DC

## 2020-01-02 MED ORDER — METFORMIN HCL 500 MG PO TABS: 500.0000 mg | ORAL_TABLET | Freq: Every day | ORAL | 0 refills | Status: DC

## 2020-01-02 MED ORDER — VORTIOXETINE HBR 20 MG PO TABS: 20.0000 mg | ORAL_TABLET | Freq: Every day | ORAL | 0 refills | Status: DC

## 2020-01-02 NOTE — Progress Notes (Signed)
Chief Complaint:   OBESITY Morgan Mejia is here to discuss her progress with her obesity treatment plan along with follow-up of her obesity related diagnoses. Morgan Mejia is on the Category 3 Plan and states she is following her eating plan approximately 0% of the time. Morgan Mejia states she is exercising 0 minutes 0 times per week.  Today's visit was #: 12 Starting weight: 181 lbs Starting date: 10/17/2018 Today's weight: 180 lbs Today's date: 01/02/2020 Total lbs lost to date: 1 Total lbs lost since last in-office visit: 0  Interim History: Morgan Mejia feels ready to give up. She mentions she cooks 2 times a week - the other days are takeout. She does mention she never had to think about food before and also mentions weekends are particularly hard as there is less structure and more indulgence.  Subjective:   Insulin resistance. Morgan Mejia has a diagnosis of insulin resistance based on her elevated fasting insulin level >5. She continues to work on diet and exercise to decrease her risk of diabetes. She endorses carb cravings.  Lab Results  Component Value Date   INSULIN 8.2 10/17/2018   Lab Results  Component Value Date   HGBA1C 5.3 10/17/2018   Vitamin D deficiency. No nausea, vomiting, or muscle weakness. Morgan Mejia endorses fatigue. She is on prescription Vitamin D. Last Vitamin D 34.7 on 10/17/2018.  Other depression, with emotional eating. Morgan Mejia is struggling with emotional eating and using food for comfort to the extent that it is negatively impacting her health. She has been working on behavior modification techniques to help reduce her emotional eating and has been somewhat successful. She shows no sign of suicidal or homicidal ideations. Morgan Mejia is tearful during her visit and feeling very discouraged.  At risk for diabetes mellitus. Morgan Mejia is at higher than average risk for developing diabetes due to her obesity.   Assessment/Plan:   Insulin resistance. Morgan Mejia will  continue to work on weight loss, exercise, and decreasing simple carbohydrates to help decrease the risk of diabetes. Morgan Mejia agreed to follow-up with [place] as directed to closely monitor her progress. Prescription was given for metFORMIN (GLUCOPHAGE) 500 MG tablet PO QAM #30 with 0 refills.  Vitamin D deficiency. Low Vitamin D level contributes to fatigue and are associated with obesity, breast, and colon cancer. She was given a refill on her Vitamin D, Ergocalciferol, (DRISDOL) 1.25 MG (50000 UNIT) CAPS capsule every week #4 with 0 refills and will follow-up for routine testing of Vitamin D, at least 2-3 times per year to avoid over-replacement.    Other depression, with emotional eating. Behavior modification techniques were discussed today to help Akiya deal with her emotional/non-hunger eating behaviors.  Orders and follow up as documented in patient record. Refill was given for vortioxetine HBr (TRINTELLIX) 20 MG TABS tablet PO daily #30 with 0 refills.  At risk for diabetes mellitus. Morgan Mejia was given approximately 15 minutes of diabetes education and counseling today. We discussed intensive lifestyle modifications today with an emphasis on weight loss as well as increasing exercise and decreasing simple carbohydrates in her diet. We also reviewed medication options with an emphasis on risk versus benefit of those discussed.   Repetitive spaced learning was employed today to elicit superior memory formation and behavioral change.  Class 1 obesity with serious comorbidity and body mass index (BMI) of 30.0 to 30.9 in adult, unspecified obesity type.  Morgan Mejia is currently in the action stage of change. As such, her goal is to continue with weight loss efforts.  She has agreed to keeping a food journal and adhering to recommended goals of 1500-1700 calories and 115+ grams of protein daily.   Exercise goals: For substantial health benefits, adults should do at least 150 minutes (2 hours and 30 minutes)  a week of moderate-intensity, or 75 minutes (1 hour and 15 minutes) a week of vigorous-intensity aerobic physical activity, or an equivalent combination of moderate- and vigorous-intensity aerobic activity. Aerobic activity should be performed in episodes of at least 10 minutes, and preferably, it should be spread throughout the week.  Behavioral modification strategies: increasing lean protein intake, increasing vegetables, meal planning and cooking strategies and planning for success.  Morgan Mejia has agreed to follow-up with our clinic in 2 weeks. She was informed of the importance of frequent follow-up visits to maximize her success with intensive lifestyle modifications for her multiple health conditions.   Objective:   Blood pressure (!) 147/89, pulse 70, temperature 98 F (36.7 C), temperature source Oral, height 5\' 5"  (1.651 m), weight 180 lb (81.6 kg), SpO2 97 %. Body mass index is 29.95 kg/m.  General: Cooperative, alert, well developed, in no acute distress. HEENT: Conjunctivae and lids unremarkable. Cardiovascular: Regular rhythm.  Lungs: Normal work of breathing. Neurologic: No focal deficits.   Lab Results  Component Value Date   CREATININE 0.76 09/12/2018   BUN 10 09/12/2018   NA 136 09/12/2018   K 4.2 09/12/2018   CL 104 09/12/2018   CO2 26 09/12/2018   Lab Results  Component Value Date   ALT 12 09/12/2018   AST 17 09/12/2018   ALKPHOS 40 09/12/2018   BILITOT 0.5 09/12/2018   Lab Results  Component Value Date   HGBA1C 5.3 10/17/2018   Lab Results  Component Value Date   INSULIN 8.2 10/17/2018   Lab Results  Component Value Date   TSH 2.40 09/12/2018   Lab Results  Component Value Date   CHOL 162 10/17/2018   HDL 52 10/17/2018   LDLCALC 96 10/17/2018   TRIG 70 10/17/2018   CHOLHDL 3 04/14/2018   Lab Results  Component Value Date   WBC 5.9 09/12/2018   HGB 13.5 09/12/2018   HCT 39.4 09/12/2018   MCV 92.6 09/12/2018   PLT 175.0 09/12/2018   No  results found for: IRON, TIBC, FERRITIN  Attestation Statements:   Reviewed by clinician on day of visit: allergies, medications, problem list, medical history, surgical history, family history, social history, and previous encounter notes.  I, Michaelene Song, am acting as transcriptionist for Coralie Common, MD   I have reviewed the above documentation for accuracy and completeness, and I agree with the above. - Ilene Qua, MD

## 2020-01-15 ENCOUNTER — Other Ambulatory Visit (INDEPENDENT_AMBULATORY_CARE_PROVIDER_SITE_OTHER): Payer: Self-pay | Admitting: Family Medicine

## 2020-01-15 DIAGNOSIS — I10 Essential (primary) hypertension: Secondary | ICD-10-CM

## 2020-01-17 ENCOUNTER — Encounter (INDEPENDENT_AMBULATORY_CARE_PROVIDER_SITE_OTHER): Payer: Self-pay | Admitting: Family Medicine

## 2020-01-17 ENCOUNTER — Other Ambulatory Visit: Payer: Self-pay

## 2020-01-17 ENCOUNTER — Ambulatory Visit (INDEPENDENT_AMBULATORY_CARE_PROVIDER_SITE_OTHER): Payer: 59 | Admitting: Family Medicine

## 2020-01-17 VITALS — BP 133/82 | HR 78 | Temp 98.1°F | Ht 65.0 in | Wt 179.0 lb

## 2020-01-17 DIAGNOSIS — Z9189 Other specified personal risk factors, not elsewhere classified: Secondary | ICD-10-CM

## 2020-01-17 DIAGNOSIS — Z6830 Body mass index (BMI) 30.0-30.9, adult: Secondary | ICD-10-CM

## 2020-01-17 DIAGNOSIS — E559 Vitamin D deficiency, unspecified: Secondary | ICD-10-CM | POA: Diagnosis not present

## 2020-01-17 DIAGNOSIS — I10 Essential (primary) hypertension: Secondary | ICD-10-CM | POA: Diagnosis not present

## 2020-01-17 DIAGNOSIS — E669 Obesity, unspecified: Secondary | ICD-10-CM

## 2020-01-17 MED ORDER — VITAMIN D (ERGOCALCIFEROL) 1.25 MG (50000 UNIT) PO CAPS: 50000.0000 [IU] | ORAL_CAPSULE | ORAL | 0 refills | Status: DC

## 2020-01-17 MED ORDER — CHLORTHALIDONE 25 MG PO TABS: 25.0000 mg | ORAL_TABLET | Freq: Every day | ORAL | 0 refills | Status: DC

## 2020-01-21 NOTE — Progress Notes (Signed)
Chief Complaint:   OBESITY Morgan Mejia is here to discuss her progress with her obesity treatment plan along with follow-up of her obesity related diagnoses. Morgan Mejia is on keeping a food journal and adhering to recommended goals of 1500-1700 calories and 115 grams of protein and states she is following her eating plan approximately 85% of the time. Morgan Mejia states she is exercising for 0 minutes 0 times per week.  Today's visit was #: 13 Starting weight: 181 lbs Starting date: 10/17/2018 Today's weight: 179 lbs Today's date: 01/17/2020 Total lbs lost to date: 2 lbs Total lbs lost since last in-office visit: 1 lb  Interim History: Morgan Mejia had a very enjoyable Mother's Day.  She went to brunch at [employer] and her husband cooked dinner.  She has been sticking with Category 3.  The first week, she achieved her protein goal and during the second week she was averaging 80-90 grams per day.  She says that she has to try not to reward herself with food after today's appointment.  Subjective:   1. Vitamin D deficiency Morgan Mejia's Vitamin D level was 34.7 on 10/17/2018. She is currently taking prescription vitamin D 50,000 IU each week. She denies nausea, vomiting or muscle weakness.  She endorses fatigue.  2. Essential hypertension Review: taking medications as instructed, no medication side effects noted, no chest pain on exertion, no dyspnea on exertion, no swelling of ankles.  Blood pressure is controlled today.  She is taking chlorthalidone.  BP Readings from Last 3 Encounters:  01/17/20 133/82  01/02/20 (!) 147/89  11/28/19 125/81   3. At risk for osteoporosis Morgan Mejia is at higher risk of osteopenia and osteoporosis due to Vitamin D deficiency.   Assessment/Plan:   1. Vitamin D deficiency Low Vitamin D level contributes to fatigue and are associated with obesity, breast, and colon cancer. She agrees to continue to take prescription Vitamin D @50 ,000 IU every week and will follow-up for routine  testing of Vitamin D, at least 2-3 times per year to avoid over-replacement. - Vitamin D, Ergocalciferol, (DRISDOL) 1.25 MG (50000 UNIT) CAPS capsule; Take 1 capsule (50,000 Units total) by mouth every 7 (seven) days.  Dispense: 4 capsule; Refill: 0  2. Essential hypertension Morgan Mejia is working on healthy weight loss and exercise to improve blood pressure control. We will watch for signs of hypotension as she continues her lifestyle modifications. - chlorthalidone (HYGROTON) 25 MG tablet; Take 1 tablet (25 mg total) by mouth daily.  Dispense: 30 tablet; Refill: 0  3. At risk for osteoporosis Morgan Mejia was given approximately 15 minutes of osteoporosis prevention counseling today. Morgan Mejia is at risk for osteopenia and osteoporosis due to her Vitamin D deficiency. She was encouraged to take her Vitamin D and follow her higher calcium diet and increase strengthening exercise to help strengthen her bones and decrease her risk of osteopenia and osteoporosis.  Repetitive spaced learning was employed today to elicit superior memory formation and behavioral change.  4. Class 1 obesity with serious comorbidity and body mass index (BMI) of 30.0 to 30.9 in adult, unspecified obesity type Morgan Mejia is currently in the action stage of change. As such, her goal is to continue with weight loss efforts. She has agreed to keeping a food journal and adhering to recommended goals of 1500-1700 calories and 115+ grams of protein daily.   Exercise goals: For substantial health benefits, adults should do at least 150 minutes (2 hours and 30 minutes) a week of moderate-intensity, or 75 minutes (1 hour and  15 minutes) a week of vigorous-intensity aerobic physical activity, or an equivalent combination of moderate- and vigorous-intensity aerobic activity. Aerobic activity should be performed in episodes of at least 10 minutes, and preferably, it should be spread throughout the week.  Behavioral modification strategies:  increasing lean protein intake, increasing vegetables, planning for success and keeping a strict food journal.  Morgan Mejia has agreed to follow-up with our clinic in 3 weeks. She was informed of the importance of frequent follow-up visits to maximize her success with intensive lifestyle modifications for her multiple health conditions.   Objective:   Blood pressure 133/82, pulse 78, temperature 98.1 F (36.7 C), temperature source Oral, height 5\' 5"  (1.651 m), weight 179 lb (81.2 kg), SpO2 97 %. Body mass index is 29.79 kg/m.  General: Cooperative, alert, well developed, in no acute distress. HEENT: Conjunctivae and lids unremarkable. Cardiovascular: Regular rhythm.  Lungs: Normal work of breathing. Neurologic: No focal deficits.   Lab Results  Component Value Date   CREATININE 0.76 09/12/2018   BUN 10 09/12/2018   NA 136 09/12/2018   K 4.2 09/12/2018   CL 104 09/12/2018   CO2 26 09/12/2018   Lab Results  Component Value Date   ALT 12 09/12/2018   AST 17 09/12/2018   ALKPHOS 40 09/12/2018   BILITOT 0.5 09/12/2018   Lab Results  Component Value Date   HGBA1C 5.3 10/17/2018   Lab Results  Component Value Date   INSULIN 8.2 10/17/2018   Lab Results  Component Value Date   TSH 2.40 09/12/2018   Lab Results  Component Value Date   CHOL 162 10/17/2018   HDL 52 10/17/2018   LDLCALC 96 10/17/2018   TRIG 70 10/17/2018   CHOLHDL 3 04/14/2018   Lab Results  Component Value Date   WBC 5.9 09/12/2018   HGB 13.5 09/12/2018   HCT 39.4 09/12/2018   MCV 92.6 09/12/2018   PLT 175.0 09/12/2018   Attestation Statements:   Reviewed by clinician on day of visit: allergies, medications, problem list, medical history, surgical history, family history, social history, and previous encounter notes.  I, Water quality scientist, CMA, am acting as transcriptionist for Coralie Common, MD.  I have reviewed the above documentation for accuracy and completeness, and I agree with the above. -  Jinny Blossom, MD

## 2020-01-24 ENCOUNTER — Other Ambulatory Visit (INDEPENDENT_AMBULATORY_CARE_PROVIDER_SITE_OTHER): Payer: Self-pay | Admitting: Family Medicine

## 2020-01-24 DIAGNOSIS — E8881 Metabolic syndrome: Secondary | ICD-10-CM

## 2020-01-30 ENCOUNTER — Other Ambulatory Visit (INDEPENDENT_AMBULATORY_CARE_PROVIDER_SITE_OTHER): Payer: Self-pay | Admitting: Family Medicine

## 2020-01-30 DIAGNOSIS — E8881 Metabolic syndrome: Secondary | ICD-10-CM

## 2020-01-30 MED ORDER — METFORMIN HCL 500 MG PO TABS: 500.0000 mg | ORAL_TABLET | Freq: Every day | ORAL | 0 refills | Status: DC

## 2020-02-07 ENCOUNTER — Ambulatory Visit (INDEPENDENT_AMBULATORY_CARE_PROVIDER_SITE_OTHER): Payer: 59 | Admitting: Family Medicine

## 2020-02-07 ENCOUNTER — Other Ambulatory Visit: Payer: Self-pay

## 2020-02-07 ENCOUNTER — Encounter (INDEPENDENT_AMBULATORY_CARE_PROVIDER_SITE_OTHER): Payer: Self-pay | Admitting: Family Medicine

## 2020-02-07 VITALS — BP 153/80 | HR 77 | Temp 98.4°F | Ht 65.0 in | Wt 181.0 lb

## 2020-02-07 DIAGNOSIS — I10 Essential (primary) hypertension: Secondary | ICD-10-CM | POA: Diagnosis not present

## 2020-02-07 DIAGNOSIS — Z9189 Other specified personal risk factors, not elsewhere classified: Secondary | ICD-10-CM | POA: Diagnosis not present

## 2020-02-07 DIAGNOSIS — E559 Vitamin D deficiency, unspecified: Secondary | ICD-10-CM

## 2020-02-07 DIAGNOSIS — E669 Obesity, unspecified: Secondary | ICD-10-CM

## 2020-02-07 DIAGNOSIS — Z6830 Body mass index (BMI) 30.0-30.9, adult: Secondary | ICD-10-CM

## 2020-02-07 DIAGNOSIS — E8881 Metabolic syndrome: Secondary | ICD-10-CM | POA: Diagnosis not present

## 2020-02-07 MED ORDER — CHLORTHALIDONE 25 MG PO TABS: 25.0000 mg | ORAL_TABLET | Freq: Every day | ORAL | 0 refills | Status: DC

## 2020-02-07 MED ORDER — VITAMIN D (ERGOCALCIFEROL) 1.25 MG (50000 UNIT) PO CAPS: 50000.0000 [IU] | ORAL_CAPSULE | ORAL | 0 refills | Status: DC

## 2020-02-07 NOTE — Progress Notes (Signed)
Chief Complaint:   OBESITY Morgan Mejia is here to discuss her progress with her obesity treatment plan along with follow-up of her obesity related diagnoses. Morgan Mejia is on the Category 3 Plan and states she is following her eating plan approximately 70% of the time. Morgan Mejia states she is exercising 0 minutes 0 times per week.  Today's visit was #: 14 Starting weight: 181 lbs Starting date: 10/17/2018 Today's weight: 181 lbs Today's date: 02/07/2020 Total lbs lost to date: 0 Total lbs lost since last in-office visit: 0  Interim History: Morgan Mejia's graduation is next week and she voices she doesn't feel it's hit her yet that her Mejia will graduate. She last tracked 05/25 and after that hasn't journaled and has somewhat tried to adhere to the Category 3 plan. She needs to get to the grocery store to restock.  Subjective:   Vitamin D deficiency. No nausea, vomiting, or muscle weakness, but she endorses fatigue.  Insulin resistance. Morgan Mejia has a diagnosis of insulin resistance based on her elevated fasting insulin level >5. She continues to work on diet and exercise to decrease her risk of diabetes. Morgan Mejia is on metformin daily. She reports some soreness in bilateral thighs.  Lab Results  Component Value Date   INSULIN 8.2 10/17/2018   Lab Results  Component Value Date   HGBA1C 5.3 10/17/2018   Essential hypertension. Blood pressure is slightly elevated today. No chest pain, chest pressure, or headache. Morgan Mejia is on chlorthalidone.  BP Readings from Last 3 Encounters:  02/07/20 (!) 153/80  01/17/20 133/82  01/02/20 (!) 147/89   Lab Results  Component Value Date   CREATININE 0.76 09/12/2018   CREATININE 0.81 04/14/2018   At risk for osteoporosis. Morgan Mejia is at higher risk of osteopenia and osteoporosis due to Vitamin D deficiency.   Assessment/Plan:   Vitamin D deficiency. Low Vitamin D level contributes to fatigue and are associated with obesity,  breast, and colon cancer. She was given a refill on her Vitamin D, Ergocalciferol, (DRISDOL) 1.25 MG (50000 UNIT) CAPS capsule every week #4 with 0 refills and will follow-up for routine testing of Vitamin D, at least 2-3 times per year to avoid over-replacement.   Insulin resistance. Morgan Mejia will continue to work on weight loss, exercise, and decreasing simple carbohydrates to help decrease the risk of diabetes. Morgan Mejia agreed to follow-up with [place] as directed to closely monitor her progress. She will stop metformin and see if thigh pain improves.  Essential hypertension. Morgan Mejia is working on healthy weight loss and exercise to improve blood pressure control. We will watch for signs of hypotension as she continues her lifestyle modifications. Refill was given for chlorthalidone (HYGROTON) 25 MG tablet PO daily #30 with 0 refills.  At risk for osteoporosis. Morgan Mejia was given approximately 15 minutes of osteoporosis prevention counseling today. Morgan Mejia is at risk for osteopenia and osteoporosis due to her Vitamin D deficiency. She was encouraged to take her Vitamin D and follow her higher calcium diet and increase strengthening exercise to help strengthen her bones and decrease her risk of osteopenia and osteoporosis.  Repetitive spaced learning was employed today to elicit superior memory formation and behavioral change.  Class 1 obesity with serious comorbidity and body mass index (BMI) of 30.0 to 30.9 in adult, unspecified obesity type.  Morgan Mejia is currently in the action stage of change. As such, her goal is to continue with weight loss efforts. She has agreed to the Category 3 Plan and will journal 1500-1700 calories  and 115+ grams of protein daily.   Exercise goals: For substantial health benefits, adults should do at least 150 minutes (2 hours and 30 minutes) a week of moderate-intensity, or 75 minutes (1 hour and 15 minutes) a week of vigorous-intensity aerobic physical activity, or an  equivalent combination of moderate- and vigorous-intensity aerobic activity. Aerobic activity should be performed in episodes of at least 10 minutes, and preferably, it should be spread throughout the week.  Behavioral modification strategies: increasing lean protein intake, meal planning and cooking strategies, keeping healthy foods in the home and celebration eating strategies.  Morgan Mejia has agreed to follow-up with our clinic in 2.5 weeks. She was informed of the importance of frequent follow-up visits to maximize her success with intensive lifestyle modifications for her multiple health conditions.   Objective:   Blood pressure (!) 153/80, pulse 77, temperature 98.4 F (36.9 C), temperature source Oral, height 5\' 5"  (1.651 m), weight 181 lb (82.1 kg), SpO2 95 %. Body mass index is 30.12 kg/m.  General: Cooperative, alert, well developed, in no acute distress. HEENT: Conjunctivae and lids unremarkable. Cardiovascular: Regular rhythm.  Lungs: Normal work of breathing. Neurologic: No focal deficits.   Lab Results  Component Value Date   CREATININE 0.76 09/12/2018   BUN 10 09/12/2018   NA 136 09/12/2018   K 4.2 09/12/2018   CL 104 09/12/2018   CO2 26 09/12/2018   Lab Results  Component Value Date   ALT 12 09/12/2018   AST 17 09/12/2018   ALKPHOS 40 09/12/2018   BILITOT 0.5 09/12/2018   Lab Results  Component Value Date   HGBA1C 5.3 10/17/2018   Lab Results  Component Value Date   INSULIN 8.2 10/17/2018   Lab Results  Component Value Date   TSH 2.40 09/12/2018   Lab Results  Component Value Date   CHOL 162 10/17/2018   HDL 52 10/17/2018   LDLCALC 96 10/17/2018   TRIG 70 10/17/2018   CHOLHDL 3 04/14/2018   Lab Results  Component Value Date   WBC 5.9 09/12/2018   HGB 13.5 09/12/2018   HCT 39.4 09/12/2018   MCV 92.6 09/12/2018   PLT 175.0 09/12/2018   No results found for: IRON, TIBC, FERRITIN  Attestation Statements:   Reviewed by clinician on day of  visit: allergies, medications, problem list, medical history, surgical history, family history, social history, and previous encounter notes.  I, Michaelene Song, am acting as transcriptionist for Coralie Common, MD   I have reviewed the above documentation for accuracy and completeness, and I agree with the above. - Jinny Blossom, MD

## 2020-02-21 ENCOUNTER — Other Ambulatory Visit (INDEPENDENT_AMBULATORY_CARE_PROVIDER_SITE_OTHER): Payer: Self-pay | Admitting: Family Medicine

## 2020-02-21 DIAGNOSIS — I10 Essential (primary) hypertension: Secondary | ICD-10-CM

## 2020-02-26 ENCOUNTER — Ambulatory Visit (INDEPENDENT_AMBULATORY_CARE_PROVIDER_SITE_OTHER): Payer: 59 | Admitting: Family Medicine

## 2020-02-27 ENCOUNTER — Other Ambulatory Visit (INDEPENDENT_AMBULATORY_CARE_PROVIDER_SITE_OTHER): Payer: Self-pay | Admitting: Family Medicine

## 2020-02-27 DIAGNOSIS — E8881 Metabolic syndrome: Secondary | ICD-10-CM

## 2020-02-28 ENCOUNTER — Encounter (INDEPENDENT_AMBULATORY_CARE_PROVIDER_SITE_OTHER): Payer: Self-pay

## 2020-03-05 ENCOUNTER — Ambulatory Visit (INDEPENDENT_AMBULATORY_CARE_PROVIDER_SITE_OTHER): Payer: 59 | Admitting: Family Medicine

## 2020-03-19 ENCOUNTER — Encounter (INDEPENDENT_AMBULATORY_CARE_PROVIDER_SITE_OTHER): Payer: Self-pay | Admitting: Family Medicine

## 2020-03-19 ENCOUNTER — Ambulatory Visit (INDEPENDENT_AMBULATORY_CARE_PROVIDER_SITE_OTHER): Payer: 59 | Admitting: Family Medicine

## 2020-03-19 ENCOUNTER — Other Ambulatory Visit: Payer: Self-pay

## 2020-03-19 ENCOUNTER — Other Ambulatory Visit (INDEPENDENT_AMBULATORY_CARE_PROVIDER_SITE_OTHER): Payer: Self-pay | Admitting: Family Medicine

## 2020-03-19 VITALS — BP 130/81 | HR 72 | Temp 98.4°F | Ht 65.0 in | Wt 183.0 lb

## 2020-03-19 DIAGNOSIS — Z6830 Body mass index (BMI) 30.0-30.9, adult: Secondary | ICD-10-CM

## 2020-03-19 DIAGNOSIS — I10 Essential (primary) hypertension: Secondary | ICD-10-CM

## 2020-03-19 DIAGNOSIS — E559 Vitamin D deficiency, unspecified: Secondary | ICD-10-CM | POA: Diagnosis not present

## 2020-03-19 DIAGNOSIS — E8881 Metabolic syndrome: Secondary | ICD-10-CM | POA: Diagnosis not present

## 2020-03-19 DIAGNOSIS — E669 Obesity, unspecified: Secondary | ICD-10-CM

## 2020-03-19 DIAGNOSIS — Z9189 Other specified personal risk factors, not elsewhere classified: Secondary | ICD-10-CM

## 2020-03-19 DIAGNOSIS — F3289 Other specified depressive episodes: Secondary | ICD-10-CM

## 2020-03-19 DIAGNOSIS — E039 Hypothyroidism, unspecified: Secondary | ICD-10-CM | POA: Diagnosis not present

## 2020-03-19 MED ORDER — CHLORTHALIDONE 25 MG PO TABS: 25.0000 mg | ORAL_TABLET | Freq: Every day | ORAL | 0 refills | Status: DC

## 2020-03-19 MED ORDER — VORTIOXETINE HBR 20 MG PO TABS: 20.0000 mg | ORAL_TABLET | Freq: Every day | ORAL | 0 refills | Status: DC

## 2020-03-19 MED ORDER — METFORMIN HCL 500 MG PO TABS: 500.0000 mg | ORAL_TABLET | Freq: Every day | ORAL | 0 refills | Status: DC

## 2020-03-19 MED ORDER — VITAMIN D (ERGOCALCIFEROL) 1.25 MG (50000 UNIT) PO CAPS: 50000.0000 [IU] | ORAL_CAPSULE | ORAL | 0 refills | Status: DC

## 2020-03-19 MED ORDER — LEVOTHYROXINE SODIUM 137 MCG PO TABS: 137.0000 ug | ORAL_TABLET | Freq: Every day | ORAL | 0 refills | Status: AC

## 2020-03-25 NOTE — Progress Notes (Signed)
Chief Complaint:   OBESITY Morgan Mejia is here to discuss her progress with her obesity treatment plan along with follow-up of her obesity related diagnoses. Morgan Mejia is on the Category 3 Plan and keeping a food journal and adhering to recommended goals of 1500-1700 calories and 115+ grams of protein daily and states she is following her eating plan approximately 0% of the time. Morgan Mejia states she is doing 0 minutes 0 times per week.  Today's visit was #: 15 Starting weight: 181 lbs Starting date: 10/17/2018 Today's weight: 183 lbs Today's date: 03/19/2020 Total lbs lost to date: 0 Total lbs lost since last in-office visit: 0  Interim History: Morgan Mejia has returned to our clinic for the first time since 02/07/2020. She has habitually rescheduled secondary to not following the plan as strict as she would like. She is going grocery shopping in 3 days. She has an upcoming deadline so she is hoping to get more on track.  Subjective:   1. Essential hypertension Morgan Mejia's blood pressure is well controlled. She denies chest pain, chest pressure, or headaches.  2. Vitamin D deficiency Morgan Mejia denies nausea, vomiting, or muscle weakness, but she notes fatigue.  3. Hypothyroidism, unspecified type Morgan Mejia denies cold/heat intolerance or palpitations. She is on levothyroxine 137 mcg PO daily.  4. Insulin resistance Morgan Mejia's last A1c was 5.3 and insulin 8.2. She notes carbohydrate cravings.  5. Other depression, with emotional eating Morgan Mejia denies suicidal ideas or homicidal ideas. She is on Trintellix with improvement in symptoms.  6. At risk for osteoporosis Morgan Mejia is at higher risk of osteopenia and osteoporosis due to Vitamin D deficiency.   Assessment/Plan:   1. Essential hypertension Morgan Mejia is working on healthy weight loss and exercise to improve blood pressure control. We will watch for signs of hypotension as she continues her lifestyle modifications. We will refill  chlorthalidone for 1 month.  - chlorthalidone (HYGROTON) 25 MG tablet; Take 1 tablet (25 mg total) by mouth daily.  Dispense: 30 tablet; Refill: 0  2. Vitamin D deficiency Low Vitamin D level contributes to fatigue and are associated with obesity, breast, and colon cancer. We will refill prescription Vitamin D for 1 month. Morgan Mejia will follow-up for routine testing of Vitamin D, at least 2-3 times per year to avoid over-replacement.  - Vitamin D, Ergocalciferol, (DRISDOL) 1.25 MG (50000 UNIT) CAPS capsule; Take 1 capsule (50,000 Units total) by mouth every 7 (seven) days.  Dispense: 4 capsule; Refill: 0  3. Hypothyroidism, unspecified type Patient with long-standing hypothyroidism, on levothyroxine therapy. Morgan Mejia appears euthyroid. We will refill levothyroxine for 1 month. Orders and follow up as documented in patient record.  Counseling . Good thyroid control is important for overall health. Supratherapeutic thyroid levels are dangerous and will not improve weight loss results. . The correct way to take levothyroxine is fasting, with water, separated by at least 30 minutes from breakfast, and separated by more than 4 hours from calcium, iron, multivitamins, acid reflux medications (PPIs).   - levothyroxine (SYNTHROID) 137 MCG tablet; Take 1 tablet (137 mcg total) by mouth daily before breakfast.  Dispense: 30 tablet; Refill: 0  4. Insulin resistance Morgan Mejia will continue to work on weight loss, exercise, and decreasing simple carbohydrates to help decrease the risk of diabetes. We will refill metformin for 1 month. Morgan Mejia agreed to follow-up with [place] as directed to closely monitor her progress.  - metFORMIN (GLUCOPHAGE) 500 MG tablet; Take 1 tablet (500 mg total) by mouth daily with breakfast.  Dispense: 30  tablet; Refill: 0  5. Other depression, with emotional eating Behavior modification techniques were discussed today to help Morgan Mejia deal with her emotional/non-hunger eating behaviors.  We will refill Trintellix for 1 month. Orders and follow up as documented in patient record.   - vortioxetine HBr (TRINTELLIX) 20 MG TABS tablet; Take 1 tablet (20 mg total) by mouth daily.  Dispense: 90 tablet; Refill: 0  6. At risk for osteoporosis Morgan Mejia was given approximately 15 minutes of osteoporosis prevention counseling today. Morgan Mejia is at risk for osteopenia and osteoporosis due to her Vitamin D deficiency. She was encouraged to take her Vitamin D and follow her higher calcium diet and increase strengthening exercise to help strengthen her bones and decrease her risk of osteopenia and osteoporosis.  Repetitive spaced learning was employed today to elicit superior memory formation and behavioral change.  7. Class 1 obesity with serious comorbidity and body mass index (BMI) of 30.0 to 30.9 in adult, unspecified obesity type Morgan Mejia is currently in the action stage of change. As such, her goal is to continue with weight loss efforts. She has agreed to the Category 3 Plan.   Exercise goals: No exercise has been prescribed at this time.  Behavioral modification strategies: increasing lean protein intake, meal planning and cooking strategies, keeping healthy foods in the home, better snacking choices and emotional eating strategies.  Morgan Mejia has agreed to follow-up with our clinic in 3 weeks. She was informed of the importance of frequent follow-up visits to maximize her success with intensive lifestyle modifications for her multiple health conditions.   Objective:   Blood pressure 130/81, pulse 72, temperature 98.4 F (36.9 C), temperature source Oral, height 5\' 5"  (1.651 m), weight 183 lb (83 kg), SpO2 95 %. Body mass index is 30.45 kg/m.  General: Cooperative, alert, well developed, in no acute distress. HEENT: Conjunctivae and lids unremarkable. Cardiovascular: Regular rhythm.  Lungs: Normal work of breathing. Neurologic: No focal deficits.   Lab Results  Component Value  Date   CREATININE 0.76 09/12/2018   BUN 10 09/12/2018   NA 136 09/12/2018   K 4.2 09/12/2018   CL 104 09/12/2018   CO2 26 09/12/2018   Lab Results  Component Value Date   ALT 12 09/12/2018   AST 17 09/12/2018   ALKPHOS 40 09/12/2018   BILITOT 0.5 09/12/2018   Lab Results  Component Value Date   HGBA1C 5.3 10/17/2018   Lab Results  Component Value Date   INSULIN 8.2 10/17/2018   Lab Results  Component Value Date   TSH 2.40 09/12/2018   Lab Results  Component Value Date   CHOL 162 10/17/2018   HDL 52 10/17/2018   LDLCALC 96 10/17/2018   TRIG 70 10/17/2018   CHOLHDL 3 04/14/2018   Lab Results  Component Value Date   WBC 5.9 09/12/2018   HGB 13.5 09/12/2018   HCT 39.4 09/12/2018   MCV 92.6 09/12/2018   PLT 175.0 09/12/2018   No results found for: IRON, TIBC, FERRITIN  Attestation Statements:   Reviewed by clinician on day of visit: allergies, medications, problem list, medical history, surgical history, family history, social history, and previous encounter notes.   I, 11/11/2018, am acting as transcriptionist for Burt Knack, MD.  I have reviewed the above documentation for accuracy and completeness, and I agree with the above. - Reuben Likes, MD

## 2020-04-10 ENCOUNTER — Ambulatory Visit (INDEPENDENT_AMBULATORY_CARE_PROVIDER_SITE_OTHER): Payer: 59 | Admitting: Family Medicine

## 2020-04-11 ENCOUNTER — Encounter: Payer: Self-pay | Admitting: Internal Medicine

## 2020-04-11 ENCOUNTER — Other Ambulatory Visit: Payer: Self-pay

## 2020-04-11 ENCOUNTER — Ambulatory Visit: Payer: 59 | Admitting: Internal Medicine

## 2020-04-11 VITALS — BP 116/72 | HR 76 | Temp 98.8°F | Ht 65.0 in | Wt 189.0 lb

## 2020-04-11 DIAGNOSIS — R399 Unspecified symptoms and signs involving the genitourinary system: Secondary | ICD-10-CM | POA: Diagnosis not present

## 2020-04-11 LAB — POCT URINALYSIS DIPSTICK
Bilirubin, UA: NEGATIVE
Blood, UA: POSITIVE
Glucose, UA: NEGATIVE
Ketones, UA: NEGATIVE
Leukocytes, UA: NEGATIVE
Nitrite, UA: NEGATIVE
Protein, UA: NEGATIVE
Spec Grav, UA: 1.01 (ref 1.010–1.025)
Urobilinogen, UA: 0.2 E.U./dL
pH, UA: 7 (ref 5.0–8.0)

## 2020-04-11 MED ORDER — NITROFURANTOIN MONOHYD MACRO 100 MG PO CAPS: 100.0000 mg | ORAL_CAPSULE | Freq: Two times a day (BID) | ORAL | 0 refills | Status: DC

## 2020-04-11 NOTE — Progress Notes (Signed)
   Subjective:   Patient ID: Morgan Mejia, female    DOB: 1971-03-18, 49 y.o.   MRN: [id number]  HPI The patient is a 49 YO female coming in for concerns about UTI. Symptoms started about a day or so ago. Having burning with urination and some frequency and urgency as well. Denies fevers or chills or back pain or nausea or vomiting. Overall it is stable and she just does not feel well. Has tried drinking more fluids without improvement.   Review of Systems  Constitutional: Negative.   HENT: Negative.   Eyes: Negative.   Respiratory: Negative.  Negative for cough, chest tightness and shortness of breath.   Cardiovascular: Negative.  Negative for chest pain, palpitations and leg swelling.  Gastrointestinal: Negative for abdominal distention, abdominal pain, constipation, diarrhea, nausea and vomiting.  Genitourinary: Positive for dysuria, frequency and urgency.  Musculoskeletal: Negative.   Skin: Negative.   Neurological: Negative.   Psychiatric/Behavioral: Negative.     Objective:  Physical Exam Constitutional:      Appearance: She is well-developed.  HENT:     Head: Normocephalic and atraumatic.  Cardiovascular:     Rate and Rhythm: Normal rate and regular rhythm.  Pulmonary:     Effort: Pulmonary effort is normal. No respiratory distress.     Breath sounds: Normal breath sounds. No wheezing or rales.  Abdominal:     General: Bowel sounds are normal. There is no distension.     Palpations: Abdomen is soft.     Tenderness: There is no abdominal tenderness. There is no rebound.  Musculoskeletal:     Cervical back: Normal range of motion.  Skin:    General: Skin is warm and dry.  Neurological:     Mental Status: She is alert and oriented to person, place, and time.     Coordination: Coordination normal.     Vitals:   04/11/20 1442  BP: 116/72  Pulse: 76  Temp: 98.8 F (37.1 C)  TempSrc: Oral  SpO2: 98%  Weight: 189 lb (85.7 kg)  Height: 5\' 5"  (1.651 m)     This visit occurred during the SARS-CoV-2 public health emergency.  Safety protocols were in place, including screening questions prior to the visit, additional usage of staff PPE, and extensive cleaning of exam room while observing appropriate contact time as indicated for disinfecting solutions.   Assessment & Plan:

## 2020-04-11 NOTE — Patient Instructions (Signed)
We have sent in the macrobid to take 1 pill twice a day for 5 days to clear the infection.

## 2020-04-11 NOTE — Assessment & Plan Note (Signed)
Symptoms consistent with cystitis. U/A done in office with some mild hematuria. Rx macrobid 5 day course.

## 2020-04-15 ENCOUNTER — Other Ambulatory Visit (INDEPENDENT_AMBULATORY_CARE_PROVIDER_SITE_OTHER): Payer: Self-pay | Admitting: Family Medicine

## 2020-04-15 DIAGNOSIS — E8881 Metabolic syndrome: Secondary | ICD-10-CM

## 2020-04-16 ENCOUNTER — Encounter (INDEPENDENT_AMBULATORY_CARE_PROVIDER_SITE_OTHER): Payer: Self-pay | Admitting: Adult Health

## 2020-04-16 ENCOUNTER — Other Ambulatory Visit: Payer: Self-pay

## 2020-04-16 ENCOUNTER — Ambulatory Visit (INDEPENDENT_AMBULATORY_CARE_PROVIDER_SITE_OTHER): Payer: 59 | Admitting: Adult Health

## 2020-04-16 VITALS — BP 129/81 | HR 73 | Temp 98.0°F | Ht 65.0 in | Wt 185.0 lb

## 2020-04-16 DIAGNOSIS — Z9189 Other specified personal risk factors, not elsewhere classified: Secondary | ICD-10-CM

## 2020-04-16 DIAGNOSIS — E559 Vitamin D deficiency, unspecified: Secondary | ICD-10-CM | POA: Diagnosis not present

## 2020-04-16 DIAGNOSIS — E669 Obesity, unspecified: Secondary | ICD-10-CM

## 2020-04-16 DIAGNOSIS — Z6830 Body mass index (BMI) 30.0-30.9, adult: Secondary | ICD-10-CM

## 2020-04-16 DIAGNOSIS — I10 Essential (primary) hypertension: Secondary | ICD-10-CM | POA: Diagnosis not present

## 2020-04-16 DIAGNOSIS — E8881 Metabolic syndrome: Secondary | ICD-10-CM

## 2020-04-16 MED ORDER — VITAMIN D (ERGOCALCIFEROL) 1.25 MG (50000 UNIT) PO CAPS: 50000.0000 [IU] | ORAL_CAPSULE | ORAL | 0 refills | Status: DC

## 2020-04-16 MED ORDER — METFORMIN HCL 500 MG PO TABS: 500.0000 mg | ORAL_TABLET | Freq: Every day | ORAL | 0 refills | Status: DC

## 2020-04-20 ENCOUNTER — Other Ambulatory Visit (INDEPENDENT_AMBULATORY_CARE_PROVIDER_SITE_OTHER): Payer: Self-pay | Admitting: Family Medicine

## 2020-04-20 DIAGNOSIS — I10 Essential (primary) hypertension: Secondary | ICD-10-CM

## 2020-04-21 DIAGNOSIS — E8881 Metabolic syndrome: Secondary | ICD-10-CM | POA: Insufficient documentation

## 2020-04-21 DIAGNOSIS — I10 Essential (primary) hypertension: Secondary | ICD-10-CM | POA: Insufficient documentation

## 2020-04-21 DIAGNOSIS — E559 Vitamin D deficiency, unspecified: Secondary | ICD-10-CM | POA: Insufficient documentation

## 2020-04-21 DIAGNOSIS — Z6830 Body mass index (BMI) 30.0-30.9, adult: Secondary | ICD-10-CM | POA: Insufficient documentation

## 2020-04-21 DIAGNOSIS — E669 Obesity, unspecified: Secondary | ICD-10-CM | POA: Insufficient documentation

## 2020-04-21 NOTE — Progress Notes (Signed)
Chief Complaint:   OBESITY Morgan Mejia is here to discuss her progress with her obesity treatment plan along with follow-up of her obesity related diagnoses. Morgan Mejia is on the Category 3 Plan and states she is following her eating plan approximately 50% of the time. Morgan Mejia states she is exercising 0 minutes 0 times per week.  Today's visit was #: 16 Starting weight: 181 lbs Starting date: 10/17/2018 Today's weight: 185 lbs Today's date: 04/16/2020 Total lbs lost to date: 0 Total lbs lost since last in-office visit: 0  Interim History: Coralie recently traveled to [place] on vacation and thoroughly enjoyed herself - great! She feels that breakfast and lunch are easy to follow on the Category 3 meal plan; dinner and choosing healthier snacks have been more of a challenge.  Subjective:   Vitamin D deficiency. Morgan Mejia is on Ergocalciferol. No nausea, vomiting, or muscle weakness.    Ref. Range 10/17/2018 10:06  Vitamin D, 25-Hydroxy Latest Ref Range: 30.0 - 100.0 ng/mL 34.7   Insulin resistance. Jaslin has a diagnosis of insulin resistance based on her elevated fasting insulin level >5. She continues to work on diet and exercise to decrease her risk of diabetes. Morgan Mejia is on metformin 500 mg QAM and denies GI upset or polyphagia.  Lab Results  Component Value Date   INSULIN 8.2 10/17/2018   Lab Results  Component Value Date   HGBA1C 5.3 10/17/2018   Essential hypertension. Blood pressure is at goal on today's office visit. Morgan Mejia is on chlorthalidone 25 mg daily.  BP Readings from Last 3 Encounters:  04/16/20 129/81  04/11/20 116/72  03/19/20 130/81   Lab Results  Component Value Date   CREATININE 0.76 09/12/2018   CREATININE 0.81 04/14/2018   At risk for diabetes mellitus. Morgan Mejia is at higher than average risk for developing diabetes due to insulin resistance and obesity.   Assessment/Plan:   Vitamin D deficiency. Low Vitamin D level contributes to fatigue  and are associated with obesity, breast, and colon cancer. She was given a refill on her Vitamin D, Ergocalciferol, (DRISDOL) 1.25 MG (50000 UNIT) CAPS capsule every week #4 with 0 refills and will follow-up for routine testing of Vitamin D every 3 months.   Insulin resistance. Morgan Mejia will continue to work on weight loss, exercise, and decreasing simple carbohydrates to help decrease the risk of diabetes. Morgan Mejia agreed to follow-up with [place] as directed to closely monitor her progress. Refill was given for metFORMIN (GLUCOPHAGE) 500 MG tablet QAM #30 with 0 refills. Labs will be checked every 3 months.  Essential hypertension. Morgan Mejia is working on healthy weight loss and exercise to improve blood pressure control. We will watch for signs of hypotension as she continues her lifestyle modifications. She will continue chlorthalidone as directed and will remain well hydrated.  At risk for diabetes mellitus. Morgan Mejia was given approximately 15 minutes of diabetes education and counseling today. We discussed intensive lifestyle modifications today with an emphasis on weight loss as well as increasing exercise and decreasing simple carbohydrates in her diet. We also reviewed medication options with an emphasis on risk versus benefit of those discussed.   Repetitive spaced learning was employed today to elicit superior memory formation and behavioral change.  Class 1 obesity with serious comorbidity and body mass index (BMI) of 30.0 to 30.9 in adult, unspecified obesity type.  Morgan Mejia is currently in the action stage of change. As such, her goal is to continue with weight loss efforts. She has agreed to the  Category 3 Plan and will journal 450-600 calories and 40+ grams of protein at supper.   Handouts were provided on Recipe Guide, Eating Out Guide, and High Protein/Low Calorie Foods.  Exercise goals: No exercise has been prescribed at this time.  Behavioral modification strategies: increasing lean  protein intake, decreasing liquid calories, meal planning and cooking strategies, better snacking choices, planning for success and keeping a strict food journal.  Morgan Mejia has agreed to follow-up with our clinic in 3 weeks. She was informed of the importance of frequent follow-up visits to maximize her success with intensive lifestyle modifications for her multiple health conditions.   Objective:   Blood pressure 129/81, pulse 73, temperature 98 F (36.7 C), temperature source Oral, height 5\' 5"  (1.651 m), weight 185 lb (83.9 kg), SpO2 95 %. Body mass index is 30.79 kg/m.  General: Cooperative, alert, well developed, in no acute distress. HEENT: Conjunctivae and lids unremarkable. Cardiovascular: Regular rhythm.  Lungs: Normal work of breathing. Neurologic: No focal deficits.   Lab Results  Component Value Date   CREATININE 0.76 09/12/2018   BUN 10 09/12/2018   NA 136 09/12/2018   K 4.2 09/12/2018   CL 104 09/12/2018   CO2 26 09/12/2018   Lab Results  Component Value Date   ALT 12 09/12/2018   AST 17 09/12/2018   ALKPHOS 40 09/12/2018   BILITOT 0.5 09/12/2018   Lab Results  Component Value Date   HGBA1C 5.3 10/17/2018   Lab Results  Component Value Date   INSULIN 8.2 10/17/2018   Lab Results  Component Value Date   TSH 2.40 09/12/2018   Lab Results  Component Value Date   CHOL 162 10/17/2018   HDL 52 10/17/2018   LDLCALC 96 10/17/2018   TRIG 70 10/17/2018   CHOLHDL 3 04/14/2018   Lab Results  Component Value Date   WBC 5.9 09/12/2018   HGB 13.5 09/12/2018   HCT 39.4 09/12/2018   MCV 92.6 09/12/2018   PLT 175.0 09/12/2018   No results found for: IRON, TIBC, FERRITIN  Attestation Statements:   Reviewed by clinician on day of visit: allergies, medications, problem list, medical history, surgical history, family history, social history, and previous encounter notes.  I, 11/11/2018, am acting as Morgan Mejia for Energy manager, NP-C   I have reviewed  the above documentation for accuracy and completeness, and I agree with the above. -  [employer], NP

## 2020-04-24 ENCOUNTER — Encounter (INDEPENDENT_AMBULATORY_CARE_PROVIDER_SITE_OTHER): Payer: Self-pay

## 2020-04-24 NOTE — Telephone Encounter (Signed)
Please address

## 2020-05-07 ENCOUNTER — Ambulatory Visit (INDEPENDENT_AMBULATORY_CARE_PROVIDER_SITE_OTHER): Payer: 59 | Admitting: Adult Health

## 2020-05-19 ENCOUNTER — Other Ambulatory Visit (INDEPENDENT_AMBULATORY_CARE_PROVIDER_SITE_OTHER): Payer: Self-pay | Admitting: Adult Health

## 2020-05-19 DIAGNOSIS — I10 Essential (primary) hypertension: Secondary | ICD-10-CM

## 2020-05-21 ENCOUNTER — Other Ambulatory Visit: Payer: Self-pay

## 2020-05-21 ENCOUNTER — Ambulatory Visit (INDEPENDENT_AMBULATORY_CARE_PROVIDER_SITE_OTHER): Payer: 59 | Admitting: Adult Health

## 2020-05-21 ENCOUNTER — Other Ambulatory Visit (INDEPENDENT_AMBULATORY_CARE_PROVIDER_SITE_OTHER): Payer: Self-pay | Admitting: Adult Health

## 2020-05-21 ENCOUNTER — Encounter (INDEPENDENT_AMBULATORY_CARE_PROVIDER_SITE_OTHER): Payer: Self-pay | Admitting: Adult Health

## 2020-05-21 VITALS — BP 128/81 | HR 70 | Temp 98.7°F | Ht 65.0 in | Wt 181.0 lb

## 2020-05-21 DIAGNOSIS — E669 Obesity, unspecified: Secondary | ICD-10-CM

## 2020-05-21 DIAGNOSIS — E559 Vitamin D deficiency, unspecified: Secondary | ICD-10-CM

## 2020-05-21 DIAGNOSIS — Z6830 Body mass index (BMI) 30.0-30.9, adult: Secondary | ICD-10-CM

## 2020-05-21 DIAGNOSIS — Z9189 Other specified personal risk factors, not elsewhere classified: Secondary | ICD-10-CM

## 2020-05-21 DIAGNOSIS — E8881 Metabolic syndrome: Secondary | ICD-10-CM

## 2020-05-21 DIAGNOSIS — I10 Essential (primary) hypertension: Secondary | ICD-10-CM | POA: Diagnosis not present

## 2020-05-21 MED ORDER — VITAMIN D (ERGOCALCIFEROL) 1.25 MG (50000 UNIT) PO CAPS: 50000.0000 [IU] | ORAL_CAPSULE | ORAL | 0 refills | Status: DC

## 2020-05-22 NOTE — Progress Notes (Signed)
Chief Complaint:   OBESITY Morgan Mejia is here to discuss her progress with her obesity treatment plan along with follow-up of her obesity related diagnoses. Morgan Mejia is on the Category 3 Plan and states she is following her eating plan approximately 50% of the time. Morgan Mejia states she is exercising for 0 minutes 0 times per week.  Today's visit was #: 17 Starting weight: 181 lbs Starting date: 10/17/2018 Today's weight: 181 lbs Today's date: 05/21/2020 Total lbs lost to date: 0 Total lbs lost since last in-office visit: 4 lbs  Interim History: Morgan Mejia is surprised with the 4 pound weight loss today, yet very pleased!  She has been trying to find meals to stay within the calorie/protein goal that will also leave her full/satisfied to prevent nighttime snacking.  Subjective:   1. Vitamin D deficiency Morgan Mejia's Vitamin D level was 34.7 on 10/17/2018. She is currently taking prescription vitamin D 50,000 IU each week. She denies nausea, vomiting or muscle weakness.    2. Insulin resistance Morgan Mejia has a diagnosis of insulin resistance based on her elevated fasting insulin level >5. She continues to work on diet and exercise to decrease her risk of diabetes.  She is on metformin 500 mg daily.  Denies GI upset.  She denies polyphagia.  BG has also been elevated in the past when EPIC reviewed.  Lab Results  Component Value Date   INSULIN 8.2 10/17/2018   Lab Results  Component Value Date   HGBA1C 5.3 10/17/2018   3. Essential hypertension Review: taking medications as instructed, no medication side effects noted, no chest pain on exertion, no dyspnea on exertion, no swelling of ankles.  Blood pressure is at goal in the office today.  She is on chlorthalidone 25 mg daily.  BP Readings from Last 3 Encounters:  05/21/20 128/81  04/16/20 129/81  04/11/20 116/72   4. At risk for diabetes mellitus Morgan Mejia is at higher than average risk for developing diabetes due to her obesity, elevated  blood glucose, and insulin resistance.   Assessment/Plan:   1. Vitamin D deficiency Low Vitamin D level contributes to fatigue and are associated with obesity, breast, and colon cancer. She agrees to continue to take prescription Vitamin D @50 ,000 IU every week and will follow-up for routine testing of Vitamin D, at least 2-3 times per year to avoid over-replacement.  Check labs at next office visit.  -Refill Vitamin D, Ergocalciferol, (DRISDOL) 1.25 MG (50000 UNIT) CAPS capsule; Take 1 capsule (50,000 Units total) by mouth every 7 (seven) days.  Dispense: 4 capsule; Refill: 0  2. Insulin resistance Morgan Mejia will continue to work on weight loss, exercise, and decreasing simple carbohydrates to help decrease the risk of diabetes. Morgan Mejia agreed to follow-up with Morgan Mejia as directed to closely monitor her progress.  Check labs at next office visit.  3. Essential hypertension Morgan Mejia is working on healthy weight loss and exercise to improve blood pressure control. We will watch for signs of hypotension as she continues her lifestyle modifications.  Continue healthy eating and chlorthalidone.  Check labs at next office visit.  4. At risk for diabetes mellitus Morgan Mejia was given approximately 15 minutes of diabetes education and counseling today. We discussed intensive lifestyle modifications today with an emphasis on weight loss as well as increasing exercise and decreasing simple carbohydrates in her diet. We also reviewed medication options with an emphasis on risk versus benefit of those discussed.   Repetitive spaced learning was employed today to elicit superior memory formation  and behavioral change.  5. Class 1 obesity with serious comorbidity and body mass index (BMI) of 30.0 to 30.9 in adult, unspecified obesity type Morgan Mejia is currently in the action stage of change. As such, her goal is to continue with weight loss efforts. She has agreed to the Category 3 Plan and keeping a food journal and  adhering to recommended goals of 450-600 calories and 40 grams of protein with supper.   Exercise goals: No exercise has been prescribed at this time.  Behavioral modification strategies: increasing lean protein intake, increasing water intake, meal planning and cooking strategies and planning for success.  Handout provided:  Additional breakfast options.  Morgan Mejia has agreed to follow-up with our clinic in 2 weeks, fasting. She was informed of the importance of frequent follow-up visits to maximize her success with intensive lifestyle modifications for her multiple health conditions.   Objective:   Blood pressure 128/81, pulse 70, temperature 98.7 F (37.1 C), temperature source Oral, height 5\' 5"  (1.651 m), weight 181 lb (82.1 kg), SpO2 96 %. Body mass index is 30.12 kg/m.  General: Cooperative, alert, well developed, in no acute distress. HEENT: Conjunctivae and lids unremarkable. Cardiovascular: Regular rhythm.  Lungs: Normal work of breathing. Neurologic: No focal deficits.   Lab Results  Component Value Date   CREATININE 0.76 09/12/2018   BUN 10 09/12/2018   NA 136 09/12/2018   K 4.2 09/12/2018   CL 104 09/12/2018   CO2 26 09/12/2018   Lab Results  Component Value Date   ALT 12 09/12/2018   AST 17 09/12/2018   ALKPHOS 40 09/12/2018   BILITOT 0.5 09/12/2018   Lab Results  Component Value Date   HGBA1C 5.3 10/17/2018   Lab Results  Component Value Date   INSULIN 8.2 10/17/2018   Lab Results  Component Value Date   TSH 2.40 09/12/2018   Lab Results  Component Value Date   CHOL 162 10/17/2018   HDL 52 10/17/2018   LDLCALC 96 10/17/2018   TRIG 70 10/17/2018   CHOLHDL 3 04/14/2018   Lab Results  Component Value Date   WBC 5.9 09/12/2018   HGB 13.5 09/12/2018   HCT 39.4 09/12/2018   MCV 92.6 09/12/2018   PLT 175.0 09/12/2018   Attestation Statements:   Reviewed by clinician on day of visit: allergies, medications, problem list, medical history,  surgical history, family history, social history, and previous encounter notes.  I, 11/11/2018, CMA, am acting as Insurance claims handler for Energy manager, NP.  I have reviewed the above documentation for accuracy and completeness, and I agree with the above. -  William Hamburger, NP

## 2020-06-05 ENCOUNTER — Other Ambulatory Visit: Payer: Self-pay

## 2020-06-05 ENCOUNTER — Ambulatory Visit (INDEPENDENT_AMBULATORY_CARE_PROVIDER_SITE_OTHER): Payer: 59 | Admitting: Adult Health

## 2020-06-05 ENCOUNTER — Encounter (INDEPENDENT_AMBULATORY_CARE_PROVIDER_SITE_OTHER): Payer: Self-pay | Admitting: Adult Health

## 2020-06-05 VITALS — BP 120/79 | HR 74 | Temp 98.5°F | Ht 65.0 in | Wt 178.0 lb

## 2020-06-05 DIAGNOSIS — Z6830 Body mass index (BMI) 30.0-30.9, adult: Secondary | ICD-10-CM

## 2020-06-05 DIAGNOSIS — E669 Obesity, unspecified: Secondary | ICD-10-CM

## 2020-06-05 DIAGNOSIS — Z9189 Other specified personal risk factors, not elsewhere classified: Secondary | ICD-10-CM | POA: Diagnosis not present

## 2020-06-05 DIAGNOSIS — I10 Essential (primary) hypertension: Secondary | ICD-10-CM

## 2020-06-05 DIAGNOSIS — E88819 Insulin resistance, unspecified: Secondary | ICD-10-CM

## 2020-06-05 DIAGNOSIS — E559 Vitamin D deficiency, unspecified: Secondary | ICD-10-CM

## 2020-06-05 DIAGNOSIS — E8881 Metabolic syndrome: Secondary | ICD-10-CM

## 2020-06-05 MED ORDER — CHLORTHALIDONE 25 MG PO TABS: 25.0000 mg | ORAL_TABLET | Freq: Every day | ORAL | 0 refills | Status: DC

## 2020-06-06 LAB — LIPID PANEL
Chol/HDL Ratio: 3.5 ratio (ref 0.0–4.4)
Cholesterol, Total: 187 mg/dL (ref 100–199)
HDL: 53 mg/dL (ref >39)
LDL Chol Calc (NIH): 118 mg/dL — ABNORMAL HIGH (ref 0–99)
Triglycerides: 86 mg/dL (ref 0–149)
VLDL Cholesterol Cal: 16 mg/dL (ref 5–40)

## 2020-06-06 LAB — COMPREHENSIVE METABOLIC PANEL
ALT: 23 IU/L (ref 0–32)
AST: 25 IU/L (ref 0–40)
Albumin/Globulin Ratio: 1.6 (ref 1.2–2.2)
Albumin: 4.7 g/dL (ref 3.8–4.8)
Alkaline Phosphatase: 56 IU/L (ref 44–121)
BUN/Creatinine Ratio: 25 — ABNORMAL HIGH (ref 9–23)
BUN: 18 mg/dL (ref 6–24)
Bilirubin Total: 0.5 mg/dL (ref 0.0–1.2)
CO2: 29 mmol/L (ref 20–29)
Calcium: 9.8 mg/dL (ref 8.7–10.2)
Chloride: 98 mmol/L (ref 96–106)
Creatinine, Ser: 0.73 mg/dL (ref 0.57–1.00)
GFR calc Af Amer: 113 mL/min/{1.73_m2} (ref >59)
GFR calc non Af Amer: 98 mL/min/{1.73_m2} (ref >59)
Globulin, Total: 3 g/dL (ref 1.5–4.5)
Glucose: 102 mg/dL — ABNORMAL HIGH (ref 65–99)
Potassium: 4 mmol/L (ref 3.5–5.2)
Sodium: 140 mmol/L (ref 134–144)
Total Protein: 7.7 g/dL (ref 6.0–8.5)

## 2020-06-06 LAB — VITAMIN D 25 HYDROXY (VIT D DEFICIENCY, FRACTURES): Vit D, 25-Hydroxy: 61.6 ng/mL (ref 30.0–100.0)

## 2020-06-06 LAB — INSULIN, RANDOM: INSULIN: 10.5 u[IU]/mL (ref 2.6–24.9)

## 2020-06-06 LAB — HEMOGLOBIN A1C
Est. average glucose Bld gHb Est-mCnc: 114 mg/dL
Hgb A1c MFr Bld: 5.6 % (ref 4.8–5.6)

## 2020-06-09 NOTE — Progress Notes (Signed)
Chief Complaint:   OBESITY Morgan Mejia is here to discuss her progress with her obesity treatment plan along with follow-up of her obesity related diagnoses. Morgan Mejia is on the Category 3 Plan and states she is following her eating plan approximately 75% of the time. Morgan Mejia states she is exercising 0 minutes 0 times per week.  Today's visit was #: 18 Starting weight: 181 lbs Starting date: 10/17/2018 Today's weight: 178 lbs Today's date: 06/05/2020 Total lbs lost to date: 3 Total lbs lost since last in-office visit: 3  Interim History: Morgan Mejia has noticed an improvement in her overall energy levels since following the Category 3 meal plan more consistently and increased protein at each meal. She also feels that daily metformin 500 mg has helped decrease her carbohydrate cravings.  Subjective:   Essential hypertension. Blood pressure and heart rate are excellent at today's office visit. Morgan Mejia is on chlorthalidone 25 mg daily.  BP Readings from Last 3 Encounters:  06/05/20 120/79  05/21/20 128/81  04/16/20 129/81   Lab Results  Component Value Date   CREATININE 0.76 09/12/2018   CREATININE 0.81 04/14/2018   Vitamin D deficiency. Vitamin D level on 10/07/2018 was below goal at 34.7. Morgan Mejia is on Ergocalciferol. No nausea, vomiting, or muscle weakness.   Insulin resistance. Morgan Mejia has a diagnosis of insulin resistance based on her elevated fasting insulin level >5. She continues to work on diet and exercise to decrease her risk of diabetes. Morgan Mejia was started on metformin 500 mg at breakfast to help with carbohydrate cravings (prescription started 03/19/2020) with a great reduction in cravings.  Lab Results  Component Value Date   INSULIN 8.2 10/17/2018   Lab Results  Component Value Date   HGBA1C 5.3 10/17/2018   At risk for diabetes mellitus. Morgan Mejia is at higher than average risk for developing diabetes due to insulin resistance and obesity.    Assessment/Plan:   Essential hypertension. [name] is working on healthy weight loss and exercise to improve blood pressure control. We will watch for signs of hypotension as she continues her lifestyle modifications. Refill was given for chlorthalidone (HYGROTON) 25 MG tablet daily #30 with 0 refills. Comprehensive metabolic panel, Lipid panel labs will be checked today.   Vitamin D deficiency. Low Vitamin D level contributes to fatigue and are associated with obesity, breast, and colon cancer. VITAMIN D 25 Hydroxy (Vit-D Deficiency, Fractures) level will be checked today.   Insulin resistance. Morgan Mejia will continue to work on weight loss, exercise, and decreasing simple carbohydrates to help decrease the risk of diabetes. Morgan Mejia agreed to follow-up with [place] as directed to closely monitor her progress. She will continue to increase protein and limit simple carbohydrate intake. Labs will be checked today.   At risk for diabetes mellitus. Morgan Mejia was given approximately 15 minutes of diabetes education and counseling today. We discussed intensive lifestyle modifications today with an emphasis on weight loss as well as increasing exercise and decreasing simple carbohydrates in her diet. We also reviewed medication options with an emphasis on risk versus benefit of those discussed.   Repetitive spaced learning was employed today to elicit superior memory formation and behavioral change.  Class 1 obesity with serious comorbidity and body mass index (BMI) of 30.0 to 30.9 in adult, unspecified obesity type - BMI greater than 30 at start of program.  Morgan Mejia is currently in the action stage of change. As such, her goal is to continue with weight loss efforts. She has agreed to the Category 3  Plan.   Exercise goals: No exercise has been prescribed at this time.  Behavioral modification strategies: increasing lean protein intake, meal planning and cooking strategies and planning for success.  Morgan Mejia  has agreed to follow-up with our clinic in 2 weeks. She was informed of the importance of frequent follow-up visits to maximize her success with intensive lifestyle modifications for her multiple health conditions.   Morgan Mejia was informed we would discuss her lab results at her next visit unless there is a critical issue that needs to be addressed sooner. Morgan Mejia agreed to keep her next visit at the agreed upon time to discuss these results.  Objective:   Blood pressure 120/79, pulse 74, temperature 98.5 F (36.9 C), height 5\' 5"  (1.651 m), weight 178 lb (80.7 kg), SpO2 97 %. Body mass index is 29.62 kg/m.  General: Cooperative, alert, well developed, in no acute distress. HEENT: Conjunctivae and lids unremarkable. Cardiovascular: Regular rhythm.  Lungs: Normal work of breathing. Neurologic: No focal deficits.   Lab Results  Component Value Date   HGBA1C 5.3 10/17/2018   Lab Results  Component Value Date   INSULIN 8.2 10/17/2018   Lab Results  Component Value Date   TSH 2.40 09/12/2018   Lab Results  Component Value Date   WBC 5.9 09/12/2018   HGB 13.5 09/12/2018   HCT 39.4 09/12/2018   MCV 92.6 09/12/2018   PLT 175.0 09/12/2018   No results found for: IRON, TIBC, FERRITIN  Attestation Statements:   Reviewed by clinician on day of visit: allergies, medications, problem list, medical history, surgical history, family history, social history, and previous encounter notes.  I, 11/11/2018, am acting as Marianna [employer] for [employer] manager, NP-C   I have reviewed the above documentation for accuracy and completeness, and I agree with the above. -  [employer], NP

## 2020-06-19 ENCOUNTER — Ambulatory Visit (INDEPENDENT_AMBULATORY_CARE_PROVIDER_SITE_OTHER): Payer: 59 | Admitting: Adult Health

## 2020-06-19 ENCOUNTER — Encounter (INDEPENDENT_AMBULATORY_CARE_PROVIDER_SITE_OTHER): Payer: Self-pay | Admitting: Adult Health

## 2020-06-19 ENCOUNTER — Other Ambulatory Visit: Payer: Self-pay

## 2020-06-19 ENCOUNTER — Other Ambulatory Visit (INDEPENDENT_AMBULATORY_CARE_PROVIDER_SITE_OTHER): Payer: Self-pay | Admitting: Family Medicine

## 2020-06-19 VITALS — BP 120/75 | HR 74 | Temp 98.7°F | Ht 65.0 in | Wt 180.0 lb

## 2020-06-19 DIAGNOSIS — R7303 Prediabetes: Secondary | ICD-10-CM

## 2020-06-19 DIAGNOSIS — E7849 Other hyperlipidemia: Secondary | ICD-10-CM

## 2020-06-19 DIAGNOSIS — F3289 Other specified depressive episodes: Secondary | ICD-10-CM

## 2020-06-19 DIAGNOSIS — E669 Obesity, unspecified: Secondary | ICD-10-CM

## 2020-06-19 DIAGNOSIS — E559 Vitamin D deficiency, unspecified: Secondary | ICD-10-CM | POA: Diagnosis not present

## 2020-06-19 DIAGNOSIS — Z6830 Body mass index (BMI) 30.0-30.9, adult: Secondary | ICD-10-CM

## 2020-06-19 DIAGNOSIS — E88819 Insulin resistance, unspecified: Secondary | ICD-10-CM

## 2020-06-19 DIAGNOSIS — E8881 Metabolic syndrome: Secondary | ICD-10-CM

## 2020-06-21 ENCOUNTER — Ambulatory Visit: Payer: 59

## 2020-06-22 ENCOUNTER — Other Ambulatory Visit (INDEPENDENT_AMBULATORY_CARE_PROVIDER_SITE_OTHER): Payer: Self-pay | Admitting: Adult Health

## 2020-06-22 DIAGNOSIS — E8881 Metabolic syndrome: Secondary | ICD-10-CM

## 2020-06-23 DIAGNOSIS — R7303 Prediabetes: Secondary | ICD-10-CM | POA: Insufficient documentation

## 2020-06-23 DIAGNOSIS — E7849 Other hyperlipidemia: Secondary | ICD-10-CM | POA: Insufficient documentation

## 2020-06-23 NOTE — Progress Notes (Addendum)
Chief Complaint:   OBESITY Morgan Mejia is here to discuss her progress with her obesity treatment plan along with follow-up of her obesity related diagnoses. Morgan Mejia is on the Category 3 Plan and states she is following her eating plan approximately 75% of the time. Morgan Mejia states she is exercising 0 minutes 0 times per week.  Today's visit was #: 19 Starting weight: 181 lbs Starting date: 10/17/2018 Today's weight: 180 lbs Today's date: 06/19/2020 Total lbs lost to date: 1 Total lbs lost since last in-office visit: 0  Interim History: Morgan Mejia reports stress eating due to meeting deadlines for year end. She found herself consuming fast food ([employer]) and frequently snacking to endure the long work hours - up to 20 hours a day. She would like to increase regular daily exercise.  Subjective:   Other hyperlipidemia pure. 06/05/2020 lipid panel showed a total cholesterol of 187, HDL 53, triglycerides 86, and an LDL of 118. Morgan Mejia is not on statin therapy. She denies tobacco/vape use.   Lab Results  Component Value Date   CHOL 187 06/05/2020   HDL 53 06/05/2020   LDLCALC 118 (H) 06/05/2020   TRIG 86 06/05/2020   CHOLHDL 3.5 06/05/2020   Lab Results  Component Value Date   ALT 23 06/05/2020   AST 25 06/05/2020   ALKPHOS 56 06/05/2020   BILITOT 0.5 06/05/2020   The 10-year ASCVD risk score Denman George DC Jr., et al., 2013) is: 1.2%   Values used to calculate the score:     Age: 49 years     Sex: Female     Is Non-Hispanic African American: No     Diabetic: No     Tobacco smoker: No     Systolic Blood Pressure: 120 mmHg     Is BP treated: Yes     HDL Cholesterol: 53 mg/dL     Total Cholesterol: 187 mg/dL  Vitamin D deficiency. Vitamin D level on 06/05/2020 was 61.6, which is at goal.   Ref. Range 06/05/2020 09:12  Vitamin D, 25-Hydroxy Latest Ref Range: 30.0 - 100.0 ng/mL 61.6   Insulin Resistance: Morgan Mejia has a diagnosis of insulin resistance based on her elevated  HgA1c and was informed this puts her at greater risk of developing diabetes. She continues to work on diet and exercise to decrease her risk of diabetes. She denies nausea or hypoglycemia. 06/05/2020 blood glucose 102, A1c 5.6 with an insulin level of 10.5. All labs have worsened. Morgan Mejia is on metformin 500 mg daily and denies GI upset.  Lab Results  Component Value Date   HGBA1C 5.6 06/05/2020   Lab Results  Component Value Date   INSULIN 10.5 06/05/2020   INSULIN 8.2 10/17/2018   Assessment/Plan:   Other hyperlipidemia pure. Cardiovascular risk and specific lipid/LDL goals reviewed.  We discussed several lifestyle modifications today and Morgan Mejia will continue to work on diet, exercise and weight loss efforts. Orders and follow up as documented in patient record. Morgan Mejia will decrease red meat consumption and increase regular daily exercise. Labs will be checked every 3 months.   Counseling Intensive lifestyle modifications are the first line treatment for this issue. . Dietary changes: Increase soluble fiber. Decrease simple carbohydrates. . Exercise changes: Moderate to vigorous-intensity aerobic activity 150 minutes per week if tolerated. . Lipid-lowering medications: see documented in medical record.  Vitamin D deficiency. Low Vitamin D level contributes to fatigue and are associated with obesity, breast, and colon cancer. She will stop Ergocalciferol and convert to OTC  Vitamin D3 2,000 IU daily. She will follow-up for routine testing of Vitamin D every 3 months.   Insulin Resistance. Morgan Mejia will continue to work on weight loss, exercise, and decreasing simple carbohydrates to help decrease the risk of diabetes. She will continue metformin as directed and will really focus on increasing protein intake at each meal.  Class 1 obesity with serious comorbidity and body mass index (BMI) of 30.0 to 30.9 in adult, unspecified obesity type.  Morgan Mejia is currently in the action stage of  change. As such, her goal is to continue with weight loss efforts. She has agreed to the Category 3 Plan.   She will increase her water intake, follow the Category 3 meal plan consistently, track snack calories, and do 5 minutes of exercise/movement a day.  Exercise goals: Morgan Mejia will do 5 minutes of walking/exercise a day.  Behavioral modification strategies: increasing lean protein intake, decreasing eating out, no skipping meals, better snacking choices, emotional eating strategies and planning for success.  Morgan Mejia has agreed to follow-up with our clinic in 2 weeks. She was informed of the importance of frequent follow-up visits to maximize her success with intensive lifestyle modifications for her multiple health conditions.   Objective:   Blood pressure 120/75, pulse 74, temperature 98.7 F (37.1 C), height 5\' 5"  (1.651 m), weight 180 lb (81.6 kg), SpO2 97 %. Body mass index is 29.95 kg/m.  General: Cooperative, alert, well developed, in no acute distress. HEENT: Conjunctivae and lids unremarkable. Cardiovascular: Regular rhythm.  Lungs: Normal work of breathing. Neurologic: No focal deficits.   Lab Results  Component Value Date   CREATININE 0.73 06/05/2020   BUN 18 06/05/2020   NA 140 06/05/2020   K 4.0 06/05/2020   CL 98 06/05/2020   CO2 29 06/05/2020   Lab Results  Component Value Date   ALT 23 06/05/2020   AST 25 06/05/2020   ALKPHOS 56 06/05/2020   BILITOT 0.5 06/05/2020   Lab Results  Component Value Date   HGBA1C 5.6 06/05/2020   HGBA1C 5.3 10/17/2018   Lab Results  Component Value Date   INSULIN 10.5 06/05/2020   INSULIN 8.2 10/17/2018   Lab Results  Component Value Date   TSH 2.40 09/12/2018   Lab Results  Component Value Date   CHOL 187 06/05/2020   HDL 53 06/05/2020   LDLCALC 118 (H) 06/05/2020   TRIG 86 06/05/2020   CHOLHDL 3.5 06/05/2020   Lab Results  Component Value Date   WBC 5.9 09/12/2018   HGB 13.5 09/12/2018   HCT 39.4  09/12/2018   MCV 92.6 09/12/2018   PLT 175.0 09/12/2018   No results found for: IRON, TIBC, FERRITIN  Attestation Statements:   Reviewed by clinician on day of visit: allergies, medications, problem list, medical history, surgical history, family history, social history, and previous encounter notes.  Time spent on visit including pre-visit chart review and post-visit charting and care was 28 minutes.   I, 11/11/2018, am acting as [employer] manager, NP-C   I have reviewed the above documentation for accuracy and completeness, and I agree with the above. -  Kyndra Condron d. Tashayla Therien, NP-C

## 2020-07-03 ENCOUNTER — Ambulatory Visit (INDEPENDENT_AMBULATORY_CARE_PROVIDER_SITE_OTHER): Payer: 59 | Admitting: Adult Health

## 2020-07-07 ENCOUNTER — Other Ambulatory Visit (INDEPENDENT_AMBULATORY_CARE_PROVIDER_SITE_OTHER): Payer: Self-pay | Admitting: Adult Health

## 2020-07-07 DIAGNOSIS — I10 Essential (primary) hypertension: Secondary | ICD-10-CM

## 2020-07-08 ENCOUNTER — Ambulatory Visit: Payer: 59

## 2020-07-16 ENCOUNTER — Ambulatory Visit (INDEPENDENT_AMBULATORY_CARE_PROVIDER_SITE_OTHER): Payer: 59 | Admitting: Adult Health

## 2020-07-18 ENCOUNTER — Other Ambulatory Visit (INDEPENDENT_AMBULATORY_CARE_PROVIDER_SITE_OTHER): Payer: Self-pay | Admitting: Family Medicine

## 2020-07-18 DIAGNOSIS — F3289 Other specified depressive episodes: Secondary | ICD-10-CM

## 2020-07-19 ENCOUNTER — Other Ambulatory Visit (INDEPENDENT_AMBULATORY_CARE_PROVIDER_SITE_OTHER): Payer: Self-pay | Admitting: Family Medicine

## 2020-07-19 DIAGNOSIS — F3289 Other specified depressive episodes: Secondary | ICD-10-CM

## 2020-07-21 MED ORDER — VORTIOXETINE HBR 20 MG PO TABS: 20.0000 mg | ORAL_TABLET | Freq: Every day | ORAL | 0 refills | Status: DC

## 2020-07-21 NOTE — Telephone Encounter (Signed)
[name] with [name] 

## 2020-07-21 NOTE — Telephone Encounter (Signed)
duplicate

## 2020-07-21 NOTE — Telephone Encounter (Signed)
Refill request

## 2020-07-21 NOTE — Telephone Encounter (Signed)
[name] with Morgan Mejia

## 2020-07-24 ENCOUNTER — Encounter (INDEPENDENT_AMBULATORY_CARE_PROVIDER_SITE_OTHER): Payer: Self-pay

## 2020-07-24 ENCOUNTER — Ambulatory Visit (INDEPENDENT_AMBULATORY_CARE_PROVIDER_SITE_OTHER): Payer: 59 | Admitting: Adult Health

## 2020-07-28 ENCOUNTER — Other Ambulatory Visit (INDEPENDENT_AMBULATORY_CARE_PROVIDER_SITE_OTHER): Payer: Self-pay | Admitting: Adult Health

## 2020-07-28 DIAGNOSIS — E8881 Metabolic syndrome: Secondary | ICD-10-CM

## 2020-08-06 ENCOUNTER — Other Ambulatory Visit (INDEPENDENT_AMBULATORY_CARE_PROVIDER_SITE_OTHER): Payer: Self-pay | Admitting: Adult Health

## 2020-08-06 DIAGNOSIS — I10 Essential (primary) hypertension: Secondary | ICD-10-CM

## 2020-08-11 ENCOUNTER — Other Ambulatory Visit (INDEPENDENT_AMBULATORY_CARE_PROVIDER_SITE_OTHER): Payer: Self-pay | Admitting: Adult Health

## 2020-08-11 DIAGNOSIS — E8881 Metabolic syndrome: Secondary | ICD-10-CM

## 2020-08-20 ENCOUNTER — Ambulatory Visit (INDEPENDENT_AMBULATORY_CARE_PROVIDER_SITE_OTHER): Payer: 59 | Admitting: Adult Health

## 2020-08-20 ENCOUNTER — Encounter (INDEPENDENT_AMBULATORY_CARE_PROVIDER_SITE_OTHER): Payer: Self-pay | Admitting: Adult Health

## 2020-08-20 ENCOUNTER — Other Ambulatory Visit: Payer: Self-pay

## 2020-08-20 VITALS — BP 134/83 | HR 76 | Temp 98.3°F | Ht 65.0 in | Wt 180.0 lb

## 2020-08-20 DIAGNOSIS — I10 Essential (primary) hypertension: Secondary | ICD-10-CM | POA: Diagnosis not present

## 2020-08-20 DIAGNOSIS — Z6830 Body mass index (BMI) 30.0-30.9, adult: Secondary | ICD-10-CM | POA: Diagnosis not present

## 2020-08-20 DIAGNOSIS — E8881 Metabolic syndrome: Secondary | ICD-10-CM | POA: Diagnosis not present

## 2020-08-20 DIAGNOSIS — Z9189 Other specified personal risk factors, not elsewhere classified: Secondary | ICD-10-CM | POA: Diagnosis not present

## 2020-08-20 DIAGNOSIS — E669 Obesity, unspecified: Secondary | ICD-10-CM

## 2020-08-20 MED ORDER — CHLORTHALIDONE 25 MG PO TABS: 25.0000 mg | ORAL_TABLET | Freq: Every day | ORAL | 0 refills | Status: DC

## 2020-08-20 MED ORDER — METFORMIN HCL 500 MG PO TABS: ORAL_TABLET | ORAL | 0 refills | Status: DC

## 2020-08-20 NOTE — Progress Notes (Signed)
Chief Complaint:   OBESITY Morgan Mejia is here to discuss her progress with her obesity treatment plan along with follow-up of her obesity related diagnoses. Morgan Mejia is on the Category 3 Plan and states she is following her eating plan approximately 60% of the time. Morgan Mejia states she is exercising 0 minutes 0 times per week.  Today's visit was #: 20 Starting weight: 181 lbs Starting date: 10/17/2018 Today's weight: 180 lbs Today's date: 08/20/2020 Total lbs lost to date: 1 Total lbs lost since last in-office visit: 0  Interim History: Morgan Mejia received [employer] booster in early November and experienced flu-like symptoms for 24 hours. She has deviated from the Category 3 meal plan the last few months and is ready to refocus and get back on track!  Subjective:   Essential hypertension. Blood pressure and heart rate are stable on today's office visit. CMP on 06/05/2020 was stable.  BP Readings from Last 3 Encounters:  08/20/20 134/83  06/19/20 120/75  06/05/20 120/79   Lab Results  Component Value Date   CREATININE 0.73 06/05/2020   CREATININE 0.76 09/12/2018   CREATININE 0.81 04/14/2018   Insulin resistance. Morgan Mejia has a diagnosis of insulin resistance based on her elevated fasting insulin level >5. She continues to work on diet and exercise to decrease her risk of diabetes. 06/05/2020 blood glucose 102, A1c 5.6 with an insulin level of 10.5, which is increased from 8.2 on 10/17/2018.  Lab Results  Component Value Date   INSULIN 10.5 06/05/2020   INSULIN 8.2 10/17/2018   Lab Results  Component Value Date   HGBA1C 5.6 06/05/2020   At risk for heart disease. Morgan Mejia is at a higher than average risk for cardiovascular disease due to hypertension and obesity.   Assessment/Plan:   Essential hypertension. Morgan Mejia is working on healthy weight loss and exercise to improve blood pressure control. We will watch for signs of hypotension as she continues her  lifestyle modifications. Refill was given for chlorthalidone (HYGROTON) 25 MG tablet daily #30 with 0 refills.  Check labs at next OV.  Insulin resistance. Morgan Mejia will continue to work on weight loss, exercise, and decreasing simple carbohydrates to help decrease the risk of diabetes. Morgan Mejia agreed to follow-up with [place] as directed to closely monitor her progress. Refill was given for metFORMIN (GLUCOPHAGE) 500 MG tablet daily #30 with 0 refills.  Check labs at next OV.  At risk for heart disease. Morgan Mejia was given approximately 15 minutes of coronary artery disease prevention counseling today. She is 49 y.o. female and has risk factors for heart disease including obesity. We discussed intensive lifestyle modifications today with an emphasis on specific weight loss instructions and strategies.   Repetitive spaced learning was employed today to elicit superior memory formation and behavioral change.  Class 1 obesity with serious comorbidity and body mass index (BMI) of 30.0 to 30.9 in adult, unspecified obesity type.  Morgan Mejia is currently in the action stage of change. As such, her goal is to continue with weight loss efforts. She has agreed to the Category 3 Plan.   Handouts were provided on the  Category 3 meal plan, Grocery List, High Protein/Low Calorie Foods, and Multiple Recipes.  Exercise goals: Morgan Mejia will use resistance bands 2 times per week.  Behavioral modification strategies: increasing lean protein intake, increasing water intake, meal planning and cooking strategies and planning for success.  Morgan Mejia has agreed to follow-up with our clinic fasting in 3 weeks. She was informed of the importance of  frequent follow-up visits to maximize her success with intensive lifestyle modifications for her multiple health conditions.   Objective:   Blood pressure 134/83, pulse 76, temperature 98.3 F (36.8 C), height 5\' 5"  (1.651 m), weight 180 lb (81.6 kg), SpO2 96 %. Body mass index is  29.95 kg/m.  General: Cooperative, alert, well developed, in no acute distress. HEENT: Conjunctivae and lids unremarkable. Cardiovascular: Regular rhythm.  Lungs: Normal work of breathing. Neurologic: No focal deficits.   Lab Results  Component Value Date   CREATININE 0.73 06/05/2020   BUN 18 06/05/2020   NA 140 06/05/2020   K 4.0 06/05/2020   CL 98 06/05/2020   CO2 29 06/05/2020   Lab Results  Component Value Date   ALT 23 06/05/2020   AST 25 06/05/2020   ALKPHOS 56 06/05/2020   BILITOT 0.5 06/05/2020   Lab Results  Component Value Date   HGBA1C 5.6 06/05/2020   HGBA1C 5.3 10/17/2018   Lab Results  Component Value Date   INSULIN 10.5 06/05/2020   INSULIN 8.2 10/17/2018   Lab Results  Component Value Date   TSH 2.40 09/12/2018   Lab Results  Component Value Date   CHOL 187 06/05/2020   HDL 53 06/05/2020   LDLCALC 118 (H) 06/05/2020   TRIG 86 06/05/2020   CHOLHDL 3.5 06/05/2020   Lab Results  Component Value Date   WBC 5.9 09/12/2018   HGB 13.5 09/12/2018   HCT 39.4 09/12/2018   MCV 92.6 09/12/2018   PLT 175.0 09/12/2018   No results found for: IRON, TIBC, FERRITIN  Attestation Statements:   Reviewed by clinician on day of visit: allergies, medications, problem list, medical history, surgical history, family history, social history, and previous encounter notes.  I, 11/11/2018, am acting as [employer] manager, NP-C   I have reviewed the above documentation for accuracy and completeness, and I agree with the above. -  Iyesha Such d. Emelin Dascenzo, NP-C

## 2020-09-13 ENCOUNTER — Other Ambulatory Visit (INDEPENDENT_AMBULATORY_CARE_PROVIDER_SITE_OTHER): Payer: Self-pay | Admitting: Adult Health

## 2020-09-13 DIAGNOSIS — I10 Essential (primary) hypertension: Secondary | ICD-10-CM

## 2020-09-13 DIAGNOSIS — E8881 Metabolic syndrome: Secondary | ICD-10-CM

## 2020-09-16 ENCOUNTER — Encounter (INDEPENDENT_AMBULATORY_CARE_PROVIDER_SITE_OTHER): Payer: Self-pay | Admitting: Adult Health

## 2020-09-16 ENCOUNTER — Other Ambulatory Visit: Payer: Self-pay

## 2020-09-16 ENCOUNTER — Ambulatory Visit (INDEPENDENT_AMBULATORY_CARE_PROVIDER_SITE_OTHER): Payer: 59 | Admitting: Adult Health

## 2020-09-16 VITALS — BP 115/61 | HR 67 | Temp 98.1°F | Ht 65.0 in | Wt 186.0 lb

## 2020-09-16 DIAGNOSIS — E7849 Other hyperlipidemia: Secondary | ICD-10-CM | POA: Diagnosis not present

## 2020-09-16 DIAGNOSIS — E669 Obesity, unspecified: Secondary | ICD-10-CM

## 2020-09-16 DIAGNOSIS — I10 Essential (primary) hypertension: Secondary | ICD-10-CM | POA: Diagnosis not present

## 2020-09-16 DIAGNOSIS — E8881 Metabolic syndrome: Secondary | ICD-10-CM

## 2020-09-16 DIAGNOSIS — Z9189 Other specified personal risk factors, not elsewhere classified: Secondary | ICD-10-CM

## 2020-09-16 DIAGNOSIS — Z6831 Body mass index (BMI) 31.0-31.9, adult: Secondary | ICD-10-CM

## 2020-09-16 MED ORDER — CHLORTHALIDONE 25 MG PO TABS: 25.0000 mg | ORAL_TABLET | Freq: Every day | ORAL | 0 refills | Status: DC

## 2020-09-16 MED ORDER — METFORMIN HCL 500 MG PO TABS: ORAL_TABLET | ORAL | 0 refills | Status: DC

## 2020-09-17 LAB — COMPREHENSIVE METABOLIC PANEL
ALT: 14 IU/L (ref 0–32)
AST: 17 IU/L (ref 0–40)
Albumin/Globulin Ratio: 2 (ref 1.2–2.2)
Albumin: 4.5 g/dL (ref 3.8–4.8)
Alkaline Phosphatase: 49 IU/L (ref 44–121)
BUN/Creatinine Ratio: 22 (ref 9–23)
BUN: 18 mg/dL (ref 6–24)
Bilirubin Total: 0.3 mg/dL (ref 0.0–1.2)
CO2: 26 mmol/L (ref 20–29)
Calcium: 9.3 mg/dL (ref 8.7–10.2)
Chloride: 101 mmol/L (ref 96–106)
Creatinine, Ser: 0.81 mg/dL (ref 0.57–1.00)
GFR calc Af Amer: 99 mL/min/{1.73_m2} (ref >59)
GFR calc non Af Amer: 86 mL/min/{1.73_m2} (ref >59)
Globulin, Total: 2.3 g/dL (ref 1.5–4.5)
Glucose: 100 mg/dL — ABNORMAL HIGH (ref 65–99)
Potassium: 4.3 mmol/L (ref 3.5–5.2)
Sodium: 140 mmol/L (ref 134–144)
Total Protein: 6.8 g/dL (ref 6.0–8.5)

## 2020-09-17 LAB — LIPID PANEL
Chol/HDL Ratio: 4 ratio (ref 0.0–4.4)
Cholesterol, Total: 179 mg/dL (ref 100–199)
HDL: 45 mg/dL (ref >39)
LDL Chol Calc (NIH): 116 mg/dL — ABNORMAL HIGH (ref 0–99)
Triglycerides: 99 mg/dL (ref 0–149)
VLDL Cholesterol Cal: 18 mg/dL (ref 5–40)

## 2020-09-17 LAB — HEMOGLOBIN A1C
Est. average glucose Bld gHb Est-mCnc: 108 mg/dL
Hgb A1c MFr Bld: 5.4 % (ref 4.8–5.6)

## 2020-09-17 LAB — INSULIN, RANDOM: INSULIN: 7.4 u[IU]/mL (ref 2.6–24.9)

## 2020-09-17 NOTE — Progress Notes (Signed)
Chief Complaint:   OBESITY Morgan Mejia is here to discuss her progress with her obesity treatment plan along with follow-up of her obesity related diagnoses. Morgan Mejia is on the Category 3 Plan and states she is following her eating plan approximately 70% of the time. Morgan Mejia states she is exercising 0 minutes 0 times per week.  Today's visit was #: 21 Starting weight: 181 lbs Starting date: 10/17/2018 Today's weight: 186 lbs Today's date: 09/16/2020 Total lbs lost to date: 0 Total lbs lost since last in-office visit: 0  Interim History: Morgan Mejia's first goal of 2022 is to loss down to less than 180 lbs and stay under 180. She plans to focus on small goals throughout 2022 to achieve an ultimate health/wellness goal- which has TBD.  Subjective:   1. Insulin resistance Morgan Mejia has a diagnosis of insulin resistance based on her elevated fasting insulin level >5. She continues to work on diet and exercise to decrease her risk of diabetes. 06/05/2020 blood glucose 102, A1c 5.6, insulin level 10.5. She is on Metformin and tolerating it well.  Lab Results  Component Value Date   INSULIN 7.4 09/16/2020   INSULIN 10.5 06/05/2020   INSULIN 8.2 10/17/2018   Lab Results  Component Value Date   HGBA1C 5.4 09/16/2020    2. Essential hypertension Morgan Mejia's blood pressure and heart rate are excellent in the office today. She is on Hygroton 25 mg daily and tolerating it well. Review: taking medications as instructed, no medication side effects noted, no chest pain on exertion, no dyspnea on exertion, no swelling of ankles.   BP Readings from Last 3 Encounters:  09/16/20 115/61  08/20/20 134/83  06/19/20 120/75    3. Other hyperlipidemia 06/05/2020 lipid panel showed LDL above goal. She is not on a statin.   Lab Results  Component Value Date   ALT 14 09/16/2020   AST 17 09/16/2020   ALKPHOS 49 09/16/2020   BILITOT 0.3 09/16/2020   Lab Results  Component Value Date   CHOL 179 09/16/2020    HDL 45 09/16/2020   LDLCALC 116 (H) 09/16/2020   TRIG 99 09/16/2020   CHOLHDL 4.0 09/16/2020    4. At risk for diabetes mellitus Morgan Mejia is at higher than average risk for developing diabetes due to obesity and insulin resistance.  Assessment/Plan:   1. Insulin resistance Morgan Mejia will continue to work on weight loss, exercise, and decreasing simple carbohydrates to help decrease the risk of diabetes. Morgan Mejia agreed to follow-up with Morgan Mejia as directed to closely monitor her progress. We will refill Metformin and check labs today.  - metFORMIN (GLUCOPHAGE) 500 MG tablet; TAKE 1 TABLET BY MOUTH EVERY DAY WITH BREAKFAST  Dispense: 30 tablet; Refill: 0 - Hemoglobin A1c - Insulin, random  2. Essential hypertension Morgan Mejia is working on healthy weight loss and exercise to improve blood pressure control. We will watch for signs of hypotension as she continues her lifestyle modifications. We will refill Hygroton and check labs today.  - chlorthalidone (HYGROTON) 25 MG tablet; Take 1 tablet (25 mg total) by mouth daily.  Dispense: 30 tablet; Refill: 0 - Comprehensive metabolic panel  3. Other hyperlipidemia Cardiovascular risk and specific lipid/LDL goals reviewed.  We discussed several lifestyle modifications today and Milena will continue to work on diet, exercise and weight loss efforts. Orders and follow up as documented in patient record. Check labs today.  Counseling Intensive lifestyle modifications are the first line treatment for this issue. . Dietary changes: Increase soluble fiber. Decrease  simple carbohydrates. . Exercise changes: Moderate to vigorous-intensity aerobic activity 150 minutes per week if tolerated. . Lipid-lowering medications: see documented in medical record.  - Comprehensive metabolic panel - Lipid panel  4. At risk for diabetes mellitus Heidy was given approximately 15 minutes of diabetes education and counseling today. We discussed intensive lifestyle  modifications today with an emphasis on weight loss as well as increasing exercise and decreasing simple carbohydrates in her diet. We also reviewed medication options with an emphasis on risk versus benefit of those discussed.   Repetitive spaced learning was employed today to elicit superior memory formation and behavioral change.  5. Class 1 obesity with serious comorbidity and body mass index (BMI) of 31.0 to 31.9 in adult, unspecified obesity type Morgan Mejia is currently in the action stage of change. As such, her goal is to continue with weight loss efforts. She has agreed to the Category 2 Plan and keeping a food journal and adhering to recommended goals of 350-500 calories and 35 g protein with lunch.   Handout: Additional Breakfast options and recipes  Exercise goals: As is  Behavioral modification strategies: increasing lean protein intake, increasing water intake, meal planning and cooking strategies and planning for success.  Morgan Mejia has agreed to follow-up with our clinic in 2 weeks. She was informed of the importance of frequent follow-up visits to maximize her success with intensive lifestyle modifications for her multiple health conditions.   Morgan Mejia was informed we would discuss her lab results at her next visit unless there is a critical issue that needs to be addressed sooner. Morgan Mejia agreed to keep her next visit at the agreed upon time to discuss these results.  Objective:   Blood pressure 115/61, pulse 67, temperature 98.1 F (36.7 C), height 5\' 5"  (1.651 m), weight 186 lb (84.4 kg), SpO2 97 %. Body mass index is 30.95 kg/m.  General: Cooperative, alert, well developed, in no acute distress. HEENT: Conjunctivae and lids unremarkable. Cardiovascular: Regular rhythm.  Lungs: Normal work of breathing. Neurologic: No focal deficits.   Lab Results  Component Value Date   CREATININE 0.81 09/16/2020   BUN 18 09/16/2020   NA 140 09/16/2020   K 4.3 09/16/2020   CL 101  09/16/2020   CO2 26 09/16/2020   Lab Results  Component Value Date   ALT 14 09/16/2020   AST 17 09/16/2020   ALKPHOS 49 09/16/2020   BILITOT 0.3 09/16/2020   Lab Results  Component Value Date   HGBA1C 5.4 09/16/2020   HGBA1C 5.6 06/05/2020   HGBA1C 5.3 10/17/2018   Lab Results  Component Value Date   INSULIN 7.4 09/16/2020   INSULIN 10.5 06/05/2020   INSULIN 8.2 10/17/2018   Lab Results  Component Value Date   TSH 2.40 09/12/2018   Lab Results  Component Value Date   CHOL 179 09/16/2020   HDL 45 09/16/2020   LDLCALC 116 (H) 09/16/2020   TRIG 99 09/16/2020   CHOLHDL 4.0 09/16/2020   Lab Results  Component Value Date   WBC 5.9 09/12/2018   HGB 13.5 09/12/2018   HCT 39.4 09/12/2018   MCV 92.6 09/12/2018   PLT 175.0 09/12/2018    Attestation Statements:   Reviewed by clinician on day of visit: allergies, medications, problem list, medical history, surgical history, family history, social history, and previous encounter notes.11/11/2018, am acting as Edmund Hilda for Energy manager, NP.  I have reviewed the above documentation for accuracy and completeness, and I agree with the  above. -  Satin Boal d. Dayra Rapley, NP-C

## 2020-09-30 ENCOUNTER — Ambulatory Visit (INDEPENDENT_AMBULATORY_CARE_PROVIDER_SITE_OTHER): Payer: 59 | Admitting: Adult Health

## 2020-09-30 ENCOUNTER — Encounter (INDEPENDENT_AMBULATORY_CARE_PROVIDER_SITE_OTHER): Payer: Self-pay | Admitting: Adult Health

## 2020-09-30 ENCOUNTER — Other Ambulatory Visit: Payer: Self-pay

## 2020-09-30 VITALS — BP 120/82 | HR 72 | Temp 98.1°F | Ht 65.0 in | Wt 181.0 lb

## 2020-09-30 DIAGNOSIS — E8881 Metabolic syndrome: Secondary | ICD-10-CM | POA: Diagnosis not present

## 2020-09-30 DIAGNOSIS — E7849 Other hyperlipidemia: Secondary | ICD-10-CM | POA: Diagnosis not present

## 2020-09-30 DIAGNOSIS — E669 Obesity, unspecified: Secondary | ICD-10-CM

## 2020-09-30 DIAGNOSIS — Z6830 Body mass index (BMI) 30.0-30.9, adult: Secondary | ICD-10-CM

## 2020-09-30 NOTE — Progress Notes (Signed)
Chief Complaint:   OBESITY Morgan Mejia is here to discuss her progress with her obesity treatment plan along with follow-up of her obesity related diagnoses. Morgan Mejia is on the Category 3 Plan and states she is following her eating plan approximately 80% of the time. Morgan Mejia states she is exercising 0 minutes 0 times per week.  Today's visit was #: 22 Starting weight: 181 lbs Starting date: 10/17/2018 Today's weight: 181 lbs Today's date: 09/30/2020 Total lbs lost to date: 0 Total lbs lost since last in-office visit: 5 lbs  Interim History: Pt reports following Category 3 quite consistently M-F, then relaxing a bit over Sat/Sun. She is ready to incorporate regular exercise into her weekly routine. Interval goal is to start regular exercise and to loss at least 2 lbs by next OV.  Subjective:   1. Insulin resistance Discussed labs with patient today. 09/16/2020 blood glucose slightly above goal at 100, A1c is excellent at 5.4, insulin level improved at 7.4. 09/16/2020 CMP resulted GFR 86. Pt reports excellent reduction of carb cravings and polyphagia on Metformin 500 mg daily. She denies GI upset.   Lab Results  Component Value Date   INSULIN 7.4 09/16/2020   INSULIN 10.5 06/05/2020   INSULIN 8.2 10/17/2018   Lab Results  Component Value Date   HGBA1C 5.4 09/16/2020    2. Other hyperlipidemia, pure Discussed labs with patient today. 09/16/2020 Lipid panel resulted total cholesterol 179, HDL 45, TG 99, and LDL 116. ASCVD risk 1.5%. Pt is not on statin therapy.  Lab Results  Component Value Date   ALT 14 09/16/2020   AST 17 09/16/2020   ALKPHOS 49 09/16/2020   BILITOT 0.3 09/16/2020   Lab Results  Component Value Date   CHOL 179 09/16/2020   HDL 45 09/16/2020   LDLCALC 116 (H) 09/16/2020   TRIG 99 09/16/2020   CHOLHDL 4.0 09/16/2020    Assessment/Plan:   1. Insulin resistance Morgan Mejia will continue to work on weight loss, exercise, and decreasing simple carbohydrates to  help decrease the risk of diabetes. Morgan Mejia agreed to follow-up with [place] as directed to closely monitor her progress. Continue Metformin 500 mg daily.  2. Other hyperlipidemia, pure Cardiovascular risk and specific lipid/LDL goals reviewed.  We discussed several lifestyle modifications today and Morgan Mejia will continue to work on diet, exercise and weight loss efforts. Orders and follow up as documented in patient record. Increase regular exercise and reduce saturated fat in diet.  Counseling Intensive lifestyle modifications are the first line treatment for this issue. . Dietary changes: Increase soluble fiber. Decrease simple carbohydrates. . Exercise changes: Moderate to vigorous-intensity aerobic activity 150 minutes per week if tolerated. . Lipid-lowering medications: see documented in medical record.  3. Class 1 obesity with serious comorbidity and body mass index (BMI) of 30.0 to 30.9 in adult, unspecified obesity type Morgan Mejia is currently in the action stage of change. As such, her goal is to continue with weight loss efforts. She has agreed to the Category 3 Plan.   Take multivitamin with lunch.  Exercise goals: Stationary bike on Tues and Thursday for 15 minutes  Behavioral modification strategies: increasing lean protein intake, increasing water intake, meal planning and cooking strategies, better snacking choices and planning for success.  Morgan Mejia has agreed to follow-up with our clinic in 2 weeks. She was informed of the importance of frequent follow-up visits to maximize her success with intensive lifestyle modifications for her multiple health conditions.   Objective:   Blood pressure 120/82,  pulse 72, temperature 98.1 F (36.7 C), height 5\' 5"  (1.651 m), weight 181 lb (82.1 kg), SpO2 96 %. Body mass index is 30.12 kg/m.  General: Cooperative, alert, well developed, in no acute distress. HEENT: Conjunctivae and lids unremarkable. Cardiovascular: Regular rhythm.  Lungs:  Normal work of breathing. Neurologic: No focal deficits.   Lab Results  Component Value Date   CREATININE 0.81 09/16/2020   BUN 18 09/16/2020   NA 140 09/16/2020   K 4.3 09/16/2020   CL 101 09/16/2020   CO2 26 09/16/2020   Lab Results  Component Value Date   ALT 14 09/16/2020   AST 17 09/16/2020   ALKPHOS 49 09/16/2020   BILITOT 0.3 09/16/2020   Lab Results  Component Value Date   HGBA1C 5.4 09/16/2020   HGBA1C 5.6 06/05/2020   HGBA1C 5.3 10/17/2018   Lab Results  Component Value Date   INSULIN 7.4 09/16/2020   INSULIN 10.5 06/05/2020   INSULIN 8.2 10/17/2018   Lab Results  Component Value Date   TSH 2.40 09/12/2018   Lab Results  Component Value Date   CHOL 179 09/16/2020   HDL 45 09/16/2020   LDLCALC 116 (H) 09/16/2020   TRIG 99 09/16/2020   CHOLHDL 4.0 09/16/2020   Lab Results  Component Value Date   WBC 5.9 09/12/2018   HGB 13.5 09/12/2018   HCT 39.4 09/12/2018   MCV 92.6 09/12/2018   PLT 175.0 09/12/2018   No results found for: IRON, TIBC, FERRITIN  Attestation Statements:   Reviewed by clinician on day of visit: allergies, medications, problem list, medical history, surgical history, family history, social history, and previous encounter notes.  Time spent on visit including pre-visit chart review and post-visit care and charting was 31 minutes.   11/11/2018, am acting as Edmund Hilda for Energy manager, NP.  I have reviewed the above documentation for accuracy and completeness, and I agree with the above. -  Edwing Figley d. Gailene Youkhana, NP-C

## 2020-10-11 ENCOUNTER — Other Ambulatory Visit (INDEPENDENT_AMBULATORY_CARE_PROVIDER_SITE_OTHER): Payer: Self-pay | Admitting: Adult Health

## 2020-10-11 DIAGNOSIS — F3289 Other specified depressive episodes: Secondary | ICD-10-CM

## 2020-10-11 DIAGNOSIS — I10 Essential (primary) hypertension: Secondary | ICD-10-CM

## 2020-10-12 ENCOUNTER — Other Ambulatory Visit (INDEPENDENT_AMBULATORY_CARE_PROVIDER_SITE_OTHER): Payer: Self-pay | Admitting: Adult Health

## 2020-10-12 DIAGNOSIS — E8881 Metabolic syndrome: Secondary | ICD-10-CM

## 2020-10-14 ENCOUNTER — Encounter (INDEPENDENT_AMBULATORY_CARE_PROVIDER_SITE_OTHER): Payer: Self-pay | Admitting: Adult Health

## 2020-10-14 ENCOUNTER — Telehealth (INDEPENDENT_AMBULATORY_CARE_PROVIDER_SITE_OTHER): Payer: 59 | Admitting: Adult Health

## 2020-10-14 DIAGNOSIS — E669 Obesity, unspecified: Secondary | ICD-10-CM | POA: Diagnosis not present

## 2020-10-14 DIAGNOSIS — Z6830 Body mass index (BMI) 30.0-30.9, adult: Secondary | ICD-10-CM | POA: Diagnosis not present

## 2020-10-14 DIAGNOSIS — E8881 Metabolic syndrome: Secondary | ICD-10-CM

## 2020-10-14 DIAGNOSIS — I10 Essential (primary) hypertension: Secondary | ICD-10-CM

## 2020-10-14 MED ORDER — CHLORTHALIDONE 25 MG PO TABS
25.0000 mg | ORAL_TABLET | Freq: Every day | ORAL | 0 refills | Status: DC
Start: 2020-10-14 — End: 2020-11-19

## 2020-10-15 NOTE — Progress Notes (Signed)
TeleHealth Visit:  Due to the COVID-19 pandemic, this visit was completed with telemedicine (audio/video) technology to reduce patient and provider exposure as well as to preserve personal protective equipment.   Morgan Mejia has verbally consented to this TeleHealth visit. The patient is located at home, the provider is located at the [facility] office. The participants in this visit include the listed provider and patient. The visit was conducted today via video.   Chief Complaint: OBESITY Morgan Mejia is here to discuss her progress with her obesity treatment plan along with follow-up of her obesity related diagnoses. Morgan Mejia is on the Category 3 Plan and states she is following her eating plan approximately 80% of the time. Morgan Mejia states she is doing 0 minutes 0 times per week.  Today's visit was #: 23 Starting weight: 181 lbs Starting date: 10/17/2018  Interim History: Morgan Mejia had a work conflict and was unable to attend in office appointment- converted to video visit. Her home scale said a weight of 183 lbs and per pt, her scale typically weighs heavier than clinic scale. She reports eating less than all the prescribed food at each meal due to concern of gaining weight.  Subjective:   1. Essential hypertension Morgan Mejia is on daily Hygroton 25 mg. She does not check ambulatory BP, however denies acute cardiac symptoms.  BP Readings from Last 3 Encounters:  09/30/20 120/82  09/16/20 115/61  08/20/20 134/83    2. Insulin resistance Morgan Mejia is taking Metformin and tolerating it well. She reports eating less than all the prescribed food on plan. She denies polyphagia.   Lab Results  Component Value Date   INSULIN 7.4 09/16/2020   INSULIN 10.5 06/05/2020   INSULIN 8.2 10/17/2018   Lab Results  Component Value Date   HGBA1C 5.4 09/16/2020    Assessment/Plan:   1. Essential hypertension Morgan Mejia is working on healthy weight loss and exercise to improve blood  pressure control. We will watch for signs of hypotension as she continues her lifestyle modifications.  - chlorthalidone (HYGROTON) 25 MG tablet; Take 1 tablet (25 mg total) by mouth daily.  Dispense: 30 tablet; Refill: 0  2. Insulin resistance Morgan Mejia will continue to work on weight loss, exercise, and decreasing simple carbohydrates to help decrease the risk of diabetes. Morgan Mejia agreed to follow-up with [place] as directed to closely monitor her progress. Continue Metformin and increase daily protein.  3. Class 1 obesity with serious comorbidity and body mass index (BMI) of 30.0 to 30.9 in adult, unspecified obesity type Morgan Mejia is currently in the action stage of change. As such, her goal is to continue with weight loss efforts. She has agreed to the Category 3 Plan.   Increase daily protein intake. Follow category 3 as prescribed with one "off plan meal per week"-then follow [facility]/[place].  Exercise goals: No exercise has been prescribed at this time.  Behavioral modification strategies: increasing lean protein intake, decreasing simple carbohydrates, no skipping meals, meal planning and cooking strategies and planning for success.  Morgan Mejia has agreed to follow-up with our clinic in 2 weeks. She was informed of the importance of frequent follow-up visits to maximize her success with intensive lifestyle modifications for her multiple health conditions.  Objective:   VITALS: Per patient if applicable, see vitals. GENERAL: Alert and in no acute distress. CARDIOPULMONARY: No increased WOB. Speaking in clear sentences.  PSYCH: Pleasant and cooperative. Speech normal rate and rhythm. Affect is appropriate. Insight and judgement are appropriate. Attention is focused, linear, and appropriate.  NEURO: Oriented as arrived to appointment on time with no prompting.   Lab Results  Component Value Date   CREATININE 0.81 09/16/2020   BUN 18 09/16/2020   NA 140 09/16/2020   K 4.3 09/16/2020   CL 101  09/16/2020   CO2 26 09/16/2020   Lab Results  Component Value Date   ALT 14 09/16/2020   AST 17 09/16/2020   ALKPHOS 49 09/16/2020   BILITOT 0.3 09/16/2020   Lab Results  Component Value Date   HGBA1C 5.4 09/16/2020   HGBA1C 5.6 06/05/2020   HGBA1C 5.3 10/17/2018   Lab Results  Component Value Date   INSULIN 7.4 09/16/2020   INSULIN 10.5 06/05/2020   INSULIN 8.2 10/17/2018   Lab Results  Component Value Date   TSH 2.40 09/12/2018   Lab Results  Component Value Date   CHOL 179 09/16/2020   HDL 45 09/16/2020   LDLCALC 116 (H) 09/16/2020   TRIG 99 09/16/2020   CHOLHDL 4.0 09/16/2020   Lab Results  Component Value Date   WBC 5.9 09/12/2018   HGB 13.5 09/12/2018   HCT 39.4 09/12/2018   MCV 92.6 09/12/2018   PLT 175.0 09/12/2018   No results found for: IRON, TIBC, FERRITIN  Attestation Statements:   Reviewed by clinician on day of visit: allergies, medications, problem list, medical history, surgical history, family history, social history, and previous encounter notes.  Time spent on visit including pre-visit chart review and post-visit charting and care was 36 minutes.   Edmund Hilda, am acting as Energy manager for William Hamburger, NP.  I have reviewed the above documentation for accuracy and completeness, and I agree with the above. - Sharifah Champine d. Maebell Lyvers, NP-C

## 2020-10-29 ENCOUNTER — Other Ambulatory Visit (INDEPENDENT_AMBULATORY_CARE_PROVIDER_SITE_OTHER): Payer: Self-pay | Admitting: Adult Health

## 2020-10-29 DIAGNOSIS — E8881 Metabolic syndrome: Secondary | ICD-10-CM

## 2020-10-29 DIAGNOSIS — F3289 Other specified depressive episodes: Secondary | ICD-10-CM

## 2020-10-29 NOTE — Telephone Encounter (Signed)
Refill request

## 2020-10-30 MED ORDER — VORTIOXETINE HBR 20 MG PO TABS: 20.0000 mg | ORAL_TABLET | Freq: Every day | ORAL | 0 refills | Status: DC

## 2020-10-30 MED ORDER — METFORMIN HCL 500 MG PO TABS: ORAL_TABLET | ORAL | 0 refills | Status: DC

## 2020-11-04 ENCOUNTER — Encounter (INDEPENDENT_AMBULATORY_CARE_PROVIDER_SITE_OTHER): Payer: Self-pay | Admitting: Adult Health

## 2020-11-04 ENCOUNTER — Ambulatory Visit (INDEPENDENT_AMBULATORY_CARE_PROVIDER_SITE_OTHER): Payer: 59 | Admitting: Adult Health

## 2020-11-04 ENCOUNTER — Other Ambulatory Visit: Payer: Self-pay

## 2020-11-04 VITALS — BP 130/80 | HR 69 | Temp 98.0°F | Ht 65.0 in | Wt 182.0 lb

## 2020-11-04 DIAGNOSIS — I10 Essential (primary) hypertension: Secondary | ICD-10-CM | POA: Diagnosis not present

## 2020-11-04 DIAGNOSIS — E8881 Metabolic syndrome: Secondary | ICD-10-CM

## 2020-11-04 DIAGNOSIS — Z6830 Body mass index (BMI) 30.0-30.9, adult: Secondary | ICD-10-CM | POA: Diagnosis not present

## 2020-11-04 DIAGNOSIS — E669 Obesity, unspecified: Secondary | ICD-10-CM

## 2020-11-05 NOTE — Progress Notes (Signed)
Chief Complaint:   OBESITY Morgan Mejia is here to discuss her progress with her obesity treatment plan along with follow-up of her obesity related diagnoses. Morgan Mejia is on the Category 3 Plan and states she is following her eating plan approximately 60% of the time. Morgan Mejia states she is walking 20-30 minutes 2 times per week.  Today's visit was #: 24 Starting weight: 181 lbs Starting date: 10/17/2018 Today's weight: 182 lbs Today's date: 11/04/2020 Total lbs lost to date: 0 Total lbs lost since last in-office visit: 0  Interim History: Morgan Mejia is challenged to stay on category 3 meal plan in the late afternoon and for dinner. She is still trying to find time and how to meal plan/prep over the weekend for the following week.  Interval goal: This Sunday, 11/09/2020, meal prep/plan for the following week; take Metformin at lunch instead of breakfast.  Subjective:   1. Insulin resistance Morgan Mejia is on Metformin 500 mg with breakfast. She is experiencing late afternoon/early evening cravings.   Lab Results  Component Value Date   INSULIN 7.4 09/16/2020   INSULIN 10.5 06/05/2020   INSULIN 8.2 10/17/2018   Lab Results  Component Value Date   HGBA1C 5.4 09/16/2020    2. Essential hypertension Morgan Mejia's BP and heart rate are stable at OV. She is on Hygroton 25 mg.  BP Readings from Last 3 Encounters:  11/04/20 130/80  09/30/20 120/82  09/16/20 115/61    Assessment/Plan:   1. Insulin resistance Morgan Mejia will continue to work on weight loss, exercise, and decreasing simple carbohydrates to help decrease the risk of diabetes. Morgan Mejia agreed to follow-up with [place] as directed to closely monitor her progress. Take Metformin at lunch and continue category 3 meal plan.  2. Essential hypertension Morgan Mejia is working on healthy weight loss and exercise to improve blood pressure control. We will watch for signs of hypotension as she continues her lifestyle modifications. Continue Hygroton 25  mg daily.  3. Class 1 obesity with serious comorbidity and body mass index (BMI) of 30.0 to 30.9 in adult, unspecified obesity type Morgan Mejia is currently in the action stage of change. As such, her goal is to continue with weight loss efforts. She has agreed to the Category 3 Plan.   Exercise goals: As is  Behavioral modification strategies: increasing lean protein intake, decreasing simple carbohydrates, meal planning and cooking strategies, better snacking choices and planning for success.  Morgan Mejia has agreed to follow-up with our clinic in 2 weeks. She was informed of the importance of frequent follow-up visits to maximize her success with intensive lifestyle modifications for her multiple health conditions.   Objective:   Blood pressure 130/80, pulse 69, temperature 98 F (36.7 C), height 5\' 5"  (1.651 m), weight 182 lb (82.6 kg), SpO2 99 %. Body mass index is 30.29 kg/m.  General: Cooperative, alert, well developed, in no acute distress. HEENT: Conjunctivae and lids unremarkable. Cardiovascular: Regular rhythm.  Lungs: Normal work of breathing. Neurologic: No focal deficits.   Lab Results  Component Value Date   CREATININE 0.81 09/16/2020   BUN 18 09/16/2020   NA 140 09/16/2020   K 4.3 09/16/2020   CL 101 09/16/2020   CO2 26 09/16/2020   Lab Results  Component Value Date   ALT 14 09/16/2020   AST 17 09/16/2020   ALKPHOS 49 09/16/2020   BILITOT 0.3 09/16/2020   Lab Results  Component Value Date   HGBA1C 5.4 09/16/2020   HGBA1C 5.6 06/05/2020   HGBA1C 5.3 10/17/2018  Lab Results  Component Value Date   INSULIN 7.4 09/16/2020   INSULIN 10.5 06/05/2020   INSULIN 8.2 10/17/2018   Lab Results  Component Value Date   TSH 2.40 09/12/2018   Lab Results  Component Value Date   CHOL 179 09/16/2020   HDL 45 09/16/2020   LDLCALC 116 (H) 09/16/2020   TRIG 99 09/16/2020   CHOLHDL 4.0 09/16/2020   Lab Results  Component Value Date   WBC 5.9 09/12/2018   HGB 13.5  09/12/2018   HCT 39.4 09/12/2018   MCV 92.6 09/12/2018   PLT 175.0 09/12/2018    Attestation Statements:   Reviewed by clinician on day of visit: allergies, medications, problem list, medical history, surgical history, family history, social history, and previous encounter notes.  Time spent on visit including pre-visit chart review and post-visit care and charting was 32 minutes.   Morgan Mejia, am acting as Energy manager for Morgan Hamburger, NP.  I have reviewed the above documentation for accuracy and completeness, and I agree with the above. -  Morgan Mejia d. Morgan Dittrich, NP-C

## 2020-11-16 ENCOUNTER — Other Ambulatory Visit (INDEPENDENT_AMBULATORY_CARE_PROVIDER_SITE_OTHER): Payer: Self-pay | Admitting: Adult Health

## 2020-11-16 DIAGNOSIS — I10 Essential (primary) hypertension: Secondary | ICD-10-CM

## 2020-11-19 ENCOUNTER — Other Ambulatory Visit (INDEPENDENT_AMBULATORY_CARE_PROVIDER_SITE_OTHER): Payer: Self-pay | Admitting: Adult Health

## 2020-11-19 DIAGNOSIS — I10 Essential (primary) hypertension: Secondary | ICD-10-CM

## 2020-11-19 MED ORDER — CHLORTHALIDONE 25 MG PO TABS: 25.0000 mg | ORAL_TABLET | Freq: Every day | ORAL | 0 refills | Status: DC

## 2020-11-19 NOTE — Telephone Encounter (Signed)
Rx refill request

## 2020-11-20 ENCOUNTER — Ambulatory Visit (INDEPENDENT_AMBULATORY_CARE_PROVIDER_SITE_OTHER): Payer: 59 | Admitting: Adult Health

## 2020-11-21 ENCOUNTER — Other Ambulatory Visit (INDEPENDENT_AMBULATORY_CARE_PROVIDER_SITE_OTHER): Payer: Self-pay | Admitting: Adult Health

## 2020-11-21 DIAGNOSIS — E8881 Metabolic syndrome: Secondary | ICD-10-CM

## 2020-11-27 ENCOUNTER — Other Ambulatory Visit: Payer: Self-pay

## 2020-11-27 ENCOUNTER — Encounter (INDEPENDENT_AMBULATORY_CARE_PROVIDER_SITE_OTHER): Payer: Self-pay | Admitting: Adult Health

## 2020-11-27 ENCOUNTER — Ambulatory Visit (INDEPENDENT_AMBULATORY_CARE_PROVIDER_SITE_OTHER): Payer: 59 | Admitting: Adult Health

## 2020-11-27 VITALS — BP 117/79 | HR 75 | Temp 98.4°F | Ht 65.0 in | Wt 182.0 lb

## 2020-11-27 DIAGNOSIS — Z9189 Other specified personal risk factors, not elsewhere classified: Secondary | ICD-10-CM

## 2020-11-27 DIAGNOSIS — I10 Essential (primary) hypertension: Secondary | ICD-10-CM

## 2020-11-27 DIAGNOSIS — E669 Obesity, unspecified: Secondary | ICD-10-CM | POA: Diagnosis not present

## 2020-11-27 DIAGNOSIS — E8881 Metabolic syndrome: Secondary | ICD-10-CM | POA: Diagnosis not present

## 2020-11-27 DIAGNOSIS — Z6830 Body mass index (BMI) 30.0-30.9, adult: Secondary | ICD-10-CM

## 2020-11-27 MED ORDER — VICTOZA 18 MG/3ML ~~LOC~~ SOPN: 0.6000 mg | PEN_INJECTOR | Freq: Every day | SUBCUTANEOUS | 0 refills | Status: DC

## 2020-11-27 MED ORDER — BD PEN NEEDLE NANO 2ND GEN 32G X 4 MM MISC: 1.0000 | Freq: Two times a day (BID) | 0 refills | Status: DC

## 2020-12-01 NOTE — Progress Notes (Signed)
Chief Complaint:   OBESITY Morgan Mejia is here to discuss her progress with her obesity treatment plan along with follow-up of her obesity related diagnoses. Morgan Mejia is on the Category 3 Plan and states she is following her eating plan approximately 60-80% of the time. Morgan Mejia states she is walking 20 minutes 2 times per week.  Today's visit was #: 25 Starting weight: 181 lbs Starting date: 10/17/2018 Today's weight: 182 lbs Today's date: 11/27/2020 Total lbs lost to date: 0 Total lbs lost since last in-office visit: 0  Interim History: Morgan Mejia did met interval goal for 1 week of grocery shopping and meal plan/prepping. She was able to follow category 3 easier that week. Then fell off meal planning/prep the remaining time. Interval goal: Follow Cat 3 at least 80% of the time.  Subjective:   1. Insulin resistance Morgan Mejia insulin level remains above goal despite being on Metformin for months. We discussed the risks/benefits of GLP-1 therapy. She denies family history of MTC. She denies personal history of pancreatitis.   Lab Results  Component Value Date   INSULIN 7.4 09/16/2020   INSULIN 10.5 06/05/2020   INSULIN 8.2 10/17/2018   Lab Results  Component Value Date   HGBA1C 5.4 09/16/2020    2. Essential hypertension Morgan Mejia BP/HR are excellent at OV. She is on Hygroton 25 mg QD and tolerating it well.   BP Readings from Last 3 Encounters:  11/27/20 117/79  11/04/20 130/80  09/30/20 120/82    3. At risk for constipation Morgan Mejia is at increased risk for constipation due to starting GLP-1 for insulin resistance and obesity. Morgan Mejia denies hard, infrequent stools currently.   Assessment/Plan:   1. Insulin resistance Alaila will continue to work on weight loss, exercise, and decreasing simple carbohydrates to help decrease the risk of diabetes. Morgan Mejia agreed to follow-up with [place] as directed to closely monitor her progress. Start Victoza 0.6 mg once a day, as per  below.  - liraglutide (VICTOZA) 18 MG/3ML SOPN; Inject 0.6 mg into the skin daily.  Dispense: 3 mL; Refill: 0 - Insulin Pen Needle (BD PEN NEEDLE NANO 2ND GEN) 32G X 4 MM MISC; 1 Package by Does not apply route in the morning and at bedtime.  Dispense: 100 each; Refill: 0  2. Essential hypertension Morgan Mejia is working on healthy weight loss and exercise to improve blood pressure control. We will watch for signs of hypotension as she continues her lifestyle modifications. Continue Hygroton 25 mg QD.  3. At risk for constipation Morgan Mejia was given approximately 15 minutes of counseling today regarding prevention of constipation. She was encouraged to increase water and fiber intake.   4. Class 1 obesity with serious comorbidity and body mass index (BMI) of 30.0 to 30.9 in adult, unspecified obesity type Morgan Mejia is currently in the action stage of change. As such, her goal is to continue with weight loss efforts. She has agreed to the Category 3 Plan.   Exercise goals: As is  Behavioral modification strategies: increasing lean protein intake, decreasing simple carbohydrates, meal planning and cooking strategies and planning for success.  Morgan Mejia has agreed to follow-up with our clinic in 2 weeks. She was informed of the importance of frequent follow-up visits to maximize her success with intensive lifestyle modifications for her multiple health conditions.   Objective:   Blood pressure 117/79, pulse 75, temperature 98.4 F (36.9 C), height '5\' 5"'  (1.651 m), weight 182 lb (82.6 kg), SpO2 96 %. Body mass index is 30.29 kg/m.  General: Cooperative, alert, well developed, in no acute distress. HEENT: Conjunctivae and lids unremarkable. Cardiovascular: Regular rhythm.  Lungs: Normal work of breathing. Neurologic: No focal deficits.   Lab Results  Component Value Date   CREATININE 0.81 09/16/2020   BUN 18 09/16/2020   NA 140 09/16/2020   K 4.3 09/16/2020   CL 101 09/16/2020   CO2 26  09/16/2020   Lab Results  Component Value Date   ALT 14 09/16/2020   AST 17 09/16/2020   ALKPHOS 49 09/16/2020   BILITOT 0.3 09/16/2020   Lab Results  Component Value Date   HGBA1C 5.4 09/16/2020   HGBA1C 5.6 06/05/2020   HGBA1C 5.3 10/17/2018   Lab Results  Component Value Date   INSULIN 7.4 09/16/2020   INSULIN 10.5 06/05/2020   INSULIN 8.2 10/17/2018   Lab Results  Component Value Date   TSH 2.40 09/12/2018   Lab Results  Component Value Date   CHOL 179 09/16/2020   HDL 45 09/16/2020   LDLCALC 116 (H) 09/16/2020   TRIG 99 09/16/2020   CHOLHDL 4.0 09/16/2020   Lab Results  Component Value Date   WBC 5.9 09/12/2018   HGB 13.5 09/12/2018   HCT 39.4 09/12/2018   MCV 92.6 09/12/2018   PLT 175.0 09/12/2018     Attestation Statements:   Reviewed by clinician on day of visit: allergies, medications, problem list, medical history, surgical history, family history, social history, and previous encounter notes.  Coral Ceo, am acting as Location manager for Mina Marble, NP.  I have reviewed the above documentation for accuracy and completeness, and I agree with the above. -  Shima Compere d. Rolf Fells, NP-C

## 2020-12-11 ENCOUNTER — Encounter (INDEPENDENT_AMBULATORY_CARE_PROVIDER_SITE_OTHER): Payer: Self-pay | Admitting: Adult Health

## 2020-12-11 ENCOUNTER — Other Ambulatory Visit: Payer: Self-pay

## 2020-12-11 ENCOUNTER — Ambulatory Visit (INDEPENDENT_AMBULATORY_CARE_PROVIDER_SITE_OTHER): Payer: 59 | Admitting: Adult Health

## 2020-12-11 VITALS — BP 146/87 | HR 82 | Temp 98.3°F | Ht 65.0 in | Wt 181.0 lb

## 2020-12-11 DIAGNOSIS — E8881 Metabolic syndrome: Secondary | ICD-10-CM

## 2020-12-11 DIAGNOSIS — Z6830 Body mass index (BMI) 30.0-30.9, adult: Secondary | ICD-10-CM

## 2020-12-11 DIAGNOSIS — E669 Obesity, unspecified: Secondary | ICD-10-CM

## 2020-12-11 DIAGNOSIS — I10 Essential (primary) hypertension: Secondary | ICD-10-CM

## 2020-12-11 DIAGNOSIS — Z9189 Other specified personal risk factors, not elsewhere classified: Secondary | ICD-10-CM | POA: Diagnosis not present

## 2020-12-11 MED ORDER — VICTOZA 18 MG/3ML ~~LOC~~ SOPN: 1.2000 mg | PEN_INJECTOR | Freq: Every day | SUBCUTANEOUS | 0 refills | Status: DC

## 2020-12-11 MED ORDER — CHLORTHALIDONE 25 MG PO TABS: 25.0000 mg | ORAL_TABLET | Freq: Every day | ORAL | 0 refills | Status: DC

## 2020-12-15 NOTE — Progress Notes (Signed)
Chief Complaint:   OBESITY Morgan Mejia is here to discuss her progress with her obesity treatment plan along with follow-up of her obesity related diagnoses. Morgan Mejia is on the Category 3 Plan and states she is following her eating plan approximately 50% of the time. Morgan Mejia states she is not currently exercising.  Today's visit was #: 26 Starting weight: 181 lbs Starting date: 10/17/2018 Today's weight: 181 lbs Today's date: 12/11/2020 Total lbs lost to date: 0 Total lbs lost since last in-office visit: 1  Interim History: The "quarter for work" has closed- wherein will allow for less hours at work and more time to focus on proper nutrition and increasing regular exercise. Morgan Mejia's daughter also started softball season, which has triggered more eating out due to practice/game schedule.  Subjective:   1. Essential hypertension Morgan Mejia's BP is slightly above goal at OV.  She reports increased work stress. She denies cardiac symptoms.  BP Readings from Last 3 Encounters:  12/11/20 (!) 146/87  11/27/20 117/79  11/04/20 130/80   2. Insulin resistance Morgan Mejia was started on Victoza 0.6 mg and tolerating it well and reports e=recent re-emergence of polyphagia.  Lab Results  Component Value Date   INSULIN 7.4 09/16/2020   INSULIN 10.5 06/05/2020   INSULIN 8.2 10/17/2018   Lab Results  Component Value Date   HGBA1C 5.4 09/16/2020    3. At risk for constipation Morgan Mejia is at increased risk for constipation due to increasing GLP-1 for insulin resistance and obesity. Morgan Mejia denies hard, infrequent stools currently.   Assessment/Plan:   1. Essential hypertension Morgan Mejia is working on healthy weight loss and exercise to improve blood pressure control. We will watch for signs of hypotension as she continues her lifestyle modifications.  - chlorthalidone (HYGROTON) 25 MG tablet; Take 1 tablet (25 mg total) by mouth daily.  Dispense: 30 tablet; Refill: 0  2. Insulin  resistance Morgan Mejia will continue to work on weight loss, exercise, and decreasing simple carbohydrates to help decrease the risk of diabetes. Morgan Mejia agreed to follow-up with [place] as directed to closely monitor her progress. Increase Victoza to 1.2 mg QD. No need for refill.  - liraglutide (VICTOZA) 18 MG/3ML SOPN; Inject 1.2 mg into the skin daily.  Dispense: 3 mL; Refill: 0  3. At risk for constipation Morgan Mejia was given approximately 15 minutes of counseling today regarding prevention of constipation. She was encouraged to increase water and fiber intake.   4. Class 1 obesity with serious comorbidity and body mass index (BMI) of 30.0 to 30.9 in adult, unspecified obesity type Morgan Mejia is currently in the action stage of change. As such, her goal is to continue with weight loss efforts. She has agreed to the Category 3 Plan and keeping a food journal and adhering to recommended goals of 450-600 calories and 40 protein with supper.   Handout: Eating Out Guide Exercise goals: As is  Behavioral modification strategies: increasing lean protein intake, decreasing simple carbohydrates, decreasing eating out, no skipping meals, meal planning and cooking strategies and planning for success.  Morgan Mejia has agreed to follow-up with our clinic in 4 weeks. She was informed of the importance of frequent follow-up visits to maximize her success with intensive lifestyle modifications for her multiple health conditions.   Objective:   Blood pressure (!) 146/87, pulse 82, temperature 98.3 F (36.8 C), height 5\' 5"  (1.651 m), weight 181 lb (82.1 kg), SpO2 98 %. Body mass index is 30.12 kg/m.  General: Cooperative, alert, well developed, in no acute  distress. HEENT: Conjunctivae and lids unremarkable. Cardiovascular: Regular rhythm.  Lungs: Normal work of breathing. Neurologic: No focal deficits.   Lab Results  Component Value Date   CREATININE 0.81 09/16/2020   BUN 18 09/16/2020   NA 140 09/16/2020   K  4.3 09/16/2020   CL 101 09/16/2020   CO2 26 09/16/2020   Lab Results  Component Value Date   ALT 14 09/16/2020   AST 17 09/16/2020   ALKPHOS 49 09/16/2020   BILITOT 0.3 09/16/2020   Lab Results  Component Value Date   HGBA1C 5.4 09/16/2020   HGBA1C 5.6 06/05/2020   HGBA1C 5.3 10/17/2018   Lab Results  Component Value Date   INSULIN 7.4 09/16/2020   INSULIN 10.5 06/05/2020   INSULIN 8.2 10/17/2018   Lab Results  Component Value Date   TSH 2.40 09/12/2018   Lab Results  Component Value Date   CHOL 179 09/16/2020   HDL 45 09/16/2020   LDLCALC 116 (H) 09/16/2020   TRIG 99 09/16/2020   CHOLHDL 4.0 09/16/2020   Lab Results  Component Value Date   WBC 5.9 09/12/2018   HGB 13.5 09/12/2018   HCT 39.4 09/12/2018   MCV 92.6 09/12/2018   PLT 175.0 09/12/2018    Attestation Statements:   Reviewed by clinician on day of visit: allergies, medications, problem list, medical history, surgical history, family history, social history, and previous encounter notes.  Edmund Hilda, am acting as Energy manager for William Hamburger, NP.  I have reviewed the above documentation for accuracy and completeness, and I agree with the above. -  Tacuma Graffam d. Labrina Lines, NP-C

## 2020-12-19 ENCOUNTER — Other Ambulatory Visit (INDEPENDENT_AMBULATORY_CARE_PROVIDER_SITE_OTHER): Payer: Self-pay | Admitting: Adult Health

## 2020-12-19 DIAGNOSIS — I10 Essential (primary) hypertension: Secondary | ICD-10-CM

## 2020-12-22 NOTE — Telephone Encounter (Signed)
Pt last seen by [name] Danford, FNP.  

## 2021-01-07 ENCOUNTER — Ambulatory Visit (INDEPENDENT_AMBULATORY_CARE_PROVIDER_SITE_OTHER): Payer: 59 | Admitting: Adult Health

## 2021-01-15 ENCOUNTER — Encounter (INDEPENDENT_AMBULATORY_CARE_PROVIDER_SITE_OTHER): Payer: Self-pay | Admitting: Family Medicine

## 2021-01-15 ENCOUNTER — Other Ambulatory Visit: Payer: Self-pay

## 2021-01-15 ENCOUNTER — Ambulatory Visit (INDEPENDENT_AMBULATORY_CARE_PROVIDER_SITE_OTHER): Payer: 59 | Admitting: Family Medicine

## 2021-01-15 VITALS — BP 147/84 | HR 73 | Temp 98.2°F | Ht 65.0 in | Wt 181.0 lb

## 2021-01-15 DIAGNOSIS — F3289 Other specified depressive episodes: Secondary | ICD-10-CM | POA: Diagnosis not present

## 2021-01-15 DIAGNOSIS — Z6831 Body mass index (BMI) 31.0-31.9, adult: Secondary | ICD-10-CM

## 2021-01-15 DIAGNOSIS — Z9189 Other specified personal risk factors, not elsewhere classified: Secondary | ICD-10-CM | POA: Diagnosis not present

## 2021-01-15 DIAGNOSIS — E669 Obesity, unspecified: Secondary | ICD-10-CM

## 2021-01-15 DIAGNOSIS — E8881 Metabolic syndrome: Secondary | ICD-10-CM

## 2021-01-15 MED ORDER — VORTIOXETINE HBR 20 MG PO TABS: 20.0000 mg | ORAL_TABLET | Freq: Every day | ORAL | 0 refills | Status: DC

## 2021-01-15 MED ORDER — BD PEN NEEDLE NANO 2ND GEN 32G X 4 MM MISC: 1.0000 | Freq: Two times a day (BID) | 0 refills | Status: DC

## 2021-01-15 MED ORDER — VICTOZA 18 MG/3ML ~~LOC~~ SOPN: 0.6000 mg | PEN_INJECTOR | Freq: Every day | SUBCUTANEOUS | 0 refills | Status: DC

## 2021-01-19 ENCOUNTER — Encounter (INDEPENDENT_AMBULATORY_CARE_PROVIDER_SITE_OTHER): Payer: Self-pay

## 2021-01-19 NOTE — Progress Notes (Signed)
Chief Complaint:   OBESITY Morgan Mejia is here to discuss her progress with her obesity treatment plan along with follow-up of her obesity related diagnoses. Morgan Mejia is on the Category 3 Plan and keeping a food journal and adhering to recommended goals of 450-600 calories and 40g protein with supper and states she is following her eating plan approximately 50% of the time. Morgan Mejia states she is walking 15-20 minutes 2 times per week.  Today's visit was #: 27 Starting weight: 181 lbs Starting date: 10/17/2018 Today's weight: 181 lbs Today's date: 01/15/2021 Total lbs lost to date: 0 Total lbs lost since last in-office visit: 0  Interim History: Morgan Mejia voices her BP was a little elevated last appt and today. She is doing well on Victoza 0.6 mg. Food wise, she isn't doing as well and as on plan. She is grabbing food on the way home. Breakfast and lunch are easy to follow. She is going to [employer] for [place] day weekend. 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  Subjective:   1. Insulin resistance Morgan Mejia is on Victoza with significant GI side effects with dose of 1.2 mg. She stopped meds.  2. Other depression, with emotional eating Pt denies suicidal or homicidal ideations. Morgan Mejia is on Trintellix 20 mg.  3. At risk for side effect of medication Morgan Mejia is at risk for side effects of medication due to restarting Victoza.  Assessment/Plan:   1. Insulin resistance Aundraya will continue to work on weight loss, exercise, and decreasing simple carbohydrates to help decrease the risk of diabetes. Jonika agreed to follow-up with [place] as directed to closely monitor her progress.  - liraglutide (VICTOZA) 18 MG/3ML SOPN; Inject 0.6 mg into the skin daily.  Dispense: 3 mL; Refill: 0 - Insulin Pen Needle (BD PEN NEEDLE NANO 2ND GEN) 32G X 4 MM MISC; 1 Package by Does not apply route in the morning and at bedtime.  Dispense: 100 each; Refill: 0  2. Other depression, with emotional  eating Behavior modification techniques were discussed today to help Yuritza deal with her emotional/non-hunger eating behaviors.  Orders and follow up as documented in patient record.   - vortioxetine HBr (TRINTELLIX) 20 MG TABS tablet; Take 1 tablet (20 mg total) by mouth daily.  Dispense: 90 tablet; Refill: 0  3. At risk for side effect of medication Morgan Mejia was given approximately 15 minutes of drug side effect counseling today.  We discussed side effect possibility and risk versus benefits. Morgan Mejia agreed to the medication and will contact this office if these side effects are intolerable.  Repetitive spaced learning was employed today to elicit superior memory formation and behavioral change.  4. Class 1 obesity with serious comorbidity and body mass index (BMI) of 31.0 to 31.9 in adult, unspecified obesity type  Morgan Mejia is currently in the action stage of change. As such, her goal is to continue with weight loss efforts. She has agreed to the Category 3 Plan and keeping a food journal and adhering to recommended goals of 450-600 calories and 40+ protein with dinner.   Exercise goals: As is  Behavioral modification strategies: increasing lean protein intake, meal planning and cooking strategies, keeping healthy foods in the home and planning for success.  Morgan Mejia has agreed to follow-up with our clinic in 3 weeks. She was informed of the importance of frequent follow-up visits to maximize her success with intensive lifestyle modifications for her multiple health conditions.   Objective:   Blood pressure Marland Kitchen)  147/84, pulse 73, temperature 98.2 F (36.8 C), height 5\' 5"  (1.651 m), weight 181 lb (82.1 kg), SpO2 97 %. Body mass index is 30.12 kg/m.  General: Cooperative, alert, well developed, in no acute distress. HEENT: Conjunctivae and lids unremarkable. Cardiovascular: Regular rhythm.  Lungs: Normal work of breathing. Neurologic: No focal deficits.   Lab Results  Component  Value Date   CREATININE 0.81 09/16/2020   BUN 18 09/16/2020   NA 140 09/16/2020   K 4.3 09/16/2020   CL 101 09/16/2020   CO2 26 09/16/2020   Lab Results  Component Value Date   ALT 14 09/16/2020   AST 17 09/16/2020   ALKPHOS 49 09/16/2020   BILITOT 0.3 09/16/2020   Lab Results  Component Value Date   HGBA1C 5.4 09/16/2020   HGBA1C 5.6 06/05/2020   HGBA1C 5.3 10/17/2018   Lab Results  Component Value Date   INSULIN 7.4 09/16/2020   INSULIN 10.5 06/05/2020   INSULIN 8.2 10/17/2018   Lab Results  Component Value Date   TSH 2.40 09/12/2018   Lab Results  Component Value Date   CHOL 179 09/16/2020   HDL 45 09/16/2020   LDLCALC 116 (H) 09/16/2020   TRIG 99 09/16/2020   CHOLHDL 4.0 09/16/2020   Lab Results  Component Value Date   WBC 5.9 09/12/2018   HGB 13.5 09/12/2018   HCT 39.4 09/12/2018   MCV 92.6 09/12/2018   PLT 175.0 09/12/2018    Attestation Statements:   Reviewed by clinician on day of visit: allergies, medications, problem list, medical history, surgical history, family history, social history, and previous encounter notes.  11/11/2018, CMA, am acting as transcriptionist for Edmund Hilda, MD.   I have reviewed the above documentation for accuracy and completeness, and I agree with the above. - Reuben Likes, MD

## 2021-01-24 ENCOUNTER — Other Ambulatory Visit (INDEPENDENT_AMBULATORY_CARE_PROVIDER_SITE_OTHER): Payer: Self-pay | Admitting: Adult Health

## 2021-01-24 DIAGNOSIS — I10 Essential (primary) hypertension: Secondary | ICD-10-CM

## 2021-01-25 ENCOUNTER — Other Ambulatory Visit (INDEPENDENT_AMBULATORY_CARE_PROVIDER_SITE_OTHER): Payer: Self-pay | Admitting: Adult Health

## 2021-01-25 DIAGNOSIS — E8881 Metabolic syndrome: Secondary | ICD-10-CM

## 2021-02-04 ENCOUNTER — Other Ambulatory Visit (INDEPENDENT_AMBULATORY_CARE_PROVIDER_SITE_OTHER): Payer: Self-pay | Admitting: Adult Health

## 2021-02-04 DIAGNOSIS — I10 Essential (primary) hypertension: Secondary | ICD-10-CM

## 2021-02-05 NOTE — Telephone Encounter (Signed)
Last seen Dr Ukleja 

## 2021-02-09 ENCOUNTER — Other Ambulatory Visit (INDEPENDENT_AMBULATORY_CARE_PROVIDER_SITE_OTHER): Payer: Self-pay | Admitting: Family Medicine

## 2021-02-09 ENCOUNTER — Other Ambulatory Visit (INDEPENDENT_AMBULATORY_CARE_PROVIDER_SITE_OTHER): Payer: Self-pay | Admitting: Adult Health

## 2021-02-09 DIAGNOSIS — I10 Essential (primary) hypertension: Secondary | ICD-10-CM

## 2021-02-09 DIAGNOSIS — E8881 Metabolic syndrome: Secondary | ICD-10-CM

## 2021-02-09 NOTE — Telephone Encounter (Signed)
DR Ukleja 

## 2021-02-09 NOTE — Telephone Encounter (Signed)
Dr.Ukleja 

## 2021-02-11 ENCOUNTER — Ambulatory Visit (INDEPENDENT_AMBULATORY_CARE_PROVIDER_SITE_OTHER): Payer: 59 | Admitting: Family Medicine

## 2021-02-18 ENCOUNTER — Encounter: Payer: 59 | Admitting: Internal Medicine

## 2021-03-05 ENCOUNTER — Encounter: Payer: 59 | Admitting: Internal Medicine

## 2021-04-01 ENCOUNTER — Encounter: Payer: Self-pay | Admitting: Internal Medicine

## 2021-04-01 ENCOUNTER — Other Ambulatory Visit: Payer: Self-pay

## 2021-04-01 ENCOUNTER — Ambulatory Visit (INDEPENDENT_AMBULATORY_CARE_PROVIDER_SITE_OTHER): Payer: 59 | Admitting: Internal Medicine

## 2021-04-01 VITALS — BP 128/82 | HR 73 | Temp 98.6°F | Ht 65.0 in | Wt 187.4 lb

## 2021-04-01 DIAGNOSIS — I10 Essential (primary) hypertension: Secondary | ICD-10-CM

## 2021-04-01 DIAGNOSIS — R5383 Other fatigue: Secondary | ICD-10-CM

## 2021-04-01 DIAGNOSIS — E538 Deficiency of other specified B group vitamins: Secondary | ICD-10-CM

## 2021-04-01 DIAGNOSIS — R61 Generalized hyperhidrosis: Secondary | ICD-10-CM | POA: Diagnosis not present

## 2021-04-01 DIAGNOSIS — R739 Hyperglycemia, unspecified: Secondary | ICD-10-CM

## 2021-04-01 DIAGNOSIS — L659 Nonscarring hair loss, unspecified: Secondary | ICD-10-CM | POA: Insufficient documentation

## 2021-04-01 DIAGNOSIS — Z0000 Encounter for general adult medical examination without abnormal findings: Secondary | ICD-10-CM | POA: Diagnosis not present

## 2021-04-01 DIAGNOSIS — E038 Other specified hypothyroidism: Secondary | ICD-10-CM

## 2021-04-01 DIAGNOSIS — R7303 Prediabetes: Secondary | ICD-10-CM

## 2021-04-01 DIAGNOSIS — E559 Vitamin D deficiency, unspecified: Secondary | ICD-10-CM | POA: Diagnosis not present

## 2021-04-01 DIAGNOSIS — F3289 Other specified depressive episodes: Secondary | ICD-10-CM

## 2021-04-01 LAB — LIPID PANEL
Cholesterol: 159 mg/dL (ref 0–200)
HDL: 49.7 mg/dL (ref >39.00)
LDL Cholesterol: 95 mg/dL (ref 0–99)
NonHDL: 109.22
Total CHOL/HDL Ratio: 3
Triglycerides: 70 mg/dL (ref 0.0–149.0)
VLDL: 14 mg/dL (ref 0.0–40.0)

## 2021-04-01 LAB — URINALYSIS, ROUTINE W REFLEX MICROSCOPIC
Bilirubin Urine: NEGATIVE
Ketones, ur: NEGATIVE
Leukocytes,Ua: NEGATIVE
Nitrite: NEGATIVE
Specific Gravity, Urine: 1.01 (ref 1.000–1.030)
Total Protein, Urine: NEGATIVE
Urine Glucose: NEGATIVE
Urobilinogen, UA: 0.2 (ref 0.0–1.0)
pH: 6 (ref 5.0–8.0)

## 2021-04-01 LAB — VITAMIN D 25 HYDROXY (VIT D DEFICIENCY, FRACTURES): VITD: 35.7 ng/mL (ref 30.00–100.00)

## 2021-04-01 LAB — CBC WITH DIFFERENTIAL/PLATELET
Basophils Absolute: 0 10*3/uL (ref 0.0–0.1)
Basophils Relative: 0.9 % (ref 0.0–3.0)
Eosinophils Absolute: 0.1 10*3/uL (ref 0.0–0.7)
Eosinophils Relative: 2.8 % (ref 0.0–5.0)
HCT: 39.4 % (ref 36.0–46.0)
Hemoglobin: 13.3 g/dL (ref 12.0–15.0)
Lymphocytes Relative: 31.1 % (ref 12.0–46.0)
Lymphs Abs: 1.5 10*3/uL (ref 0.7–4.0)
MCHC: 33.8 g/dL (ref 30.0–36.0)
MCV: 94.3 fl (ref 78.0–100.0)
Monocytes Absolute: 0.5 10*3/uL (ref 0.1–1.0)
Monocytes Relative: 11.2 % (ref 3.0–12.0)
Neutro Abs: 2.6 10*3/uL (ref 1.4–7.7)
Neutrophils Relative %: 54 % (ref 43.0–77.0)
Platelets: 167 10*3/uL (ref 150.0–400.0)
RBC: 4.18 Mil/uL (ref 3.87–5.11)
RDW: 13.2 % (ref 11.5–15.5)
WBC: 4.8 10*3/uL (ref 4.0–10.5)

## 2021-04-01 LAB — COMPREHENSIVE METABOLIC PANEL
ALT: 15 U/L (ref 0–35)
AST: 19 U/L (ref 0–37)
Albumin: 4.5 g/dL (ref 3.5–5.2)
Alkaline Phosphatase: 45 U/L (ref 39–117)
BUN: 18 mg/dL (ref 6–23)
CO2: 27 mEq/L (ref 19–32)
Calcium: 9.5 mg/dL (ref 8.4–10.5)
Chloride: 104 mEq/L (ref 96–112)
Creatinine, Ser: 0.81 mg/dL (ref 0.40–1.20)
GFR: 85.13 mL/min (ref >60.00)
Glucose, Bld: 100 mg/dL — ABNORMAL HIGH (ref 70–99)
Potassium: 4 mEq/L (ref 3.5–5.1)
Sodium: 137 mEq/L (ref 135–145)
Total Bilirubin: 0.5 mg/dL (ref 0.2–1.2)
Total Protein: 7.4 g/dL (ref 6.0–8.3)

## 2021-04-01 LAB — VITAMIN B12: Vitamin B-12: 246 pg/mL (ref 211–911)

## 2021-04-01 LAB — HEMOGLOBIN A1C: Hgb A1c MFr Bld: 5.5 % (ref 4.6–6.5)

## 2021-04-01 LAB — FOLLICLE STIMULATING HORMONE: FSH: 31.3 m[IU]/mL

## 2021-04-01 LAB — T4, FREE: Free T4: 1 ng/dL (ref 0.60–1.60)

## 2021-04-01 LAB — TSH: TSH: 2.75 u[IU]/mL (ref 0.35–5.50)

## 2021-04-01 MED ORDER — TIRZEPATIDE 2.5 MG/0.5ML ~~LOC~~ SOAJ: 2.5000 mg | SUBCUTANEOUS | 2 refills | Status: DC

## 2021-04-01 MED ORDER — CHLORTHALIDONE 25 MG PO TABS: 25.0000 mg | ORAL_TABLET | Freq: Every day | ORAL | 3 refills | Status: DC

## 2021-04-01 MED ORDER — VORTIOXETINE HBR 20 MG PO TABS: 20.0000 mg | ORAL_TABLET | Freq: Every day | ORAL | 3 refills | Status: DC

## 2021-04-01 MED ORDER — VITAMIN D3 50 MCG (2000 UT) PO CAPS: 4000.0000 [IU] | ORAL_CAPSULE | Freq: Every day | ORAL | 3 refills | Status: AC

## 2021-04-01 MED ORDER — B COMPLEX PLUS PO TABS: 1.0000 | ORAL_TABLET | Freq: Every day | ORAL | 3 refills | Status: AC

## 2021-04-01 NOTE — Assessment & Plan Note (Addendum)
Restart to take Vit D.  We may need to double up on the dose.

## 2021-04-01 NOTE — Progress Notes (Signed)
Subjective:  Patient ID: Morgan Mejia, female    DOB: 1971-06-05  Age: 50 y.o. MRN: [id number]  CC: Annual Exam   HPI Morgan Mejia presents for a well exam C/o hot flashes, LBP, achy all over x 1 month, hair loss Pt has IUD - no periods She was on Victoza and B12 - stopped  Outpatient Medications Prior to Visit  Medication Sig Dispense Refill   levothyroxine (SYNTHROID) 137 MCG tablet Take 1 tablet (137 mcg total) by mouth daily before breakfast. 30 tablet 0   vortioxetine HBr (TRINTELLIX) 20 MG TABS tablet Take 1 tablet (20 mg total) by mouth daily. 90 tablet 0   chlorthalidone (HYGROTON) 25 MG tablet Take 1 tablet (25 mg total) by mouth daily. (Patient not taking: Reported on 04/01/2021) 30 tablet 0   Insulin Pen Needle (BD PEN NEEDLE NANO 2ND GEN) 32G X 4 MM MISC 1 Package by Does not apply route in the morning and at bedtime. (Patient not taking: Reported on 04/01/2021) 100 each 0   liraglutide (VICTOZA) 18 MG/3ML SOPN Inject 0.6 mg into the skin daily. (Patient not taking: Reported on 04/01/2021) 3 mL 0   No facility-administered medications prior to visit.    ROS: Review of Systems  Constitutional:  Positive for diaphoresis, fatigue and unexpected weight change. Negative for activity change, appetite change and chills.  HENT:  Negative for congestion, mouth sores and sinus pressure.   Eyes:  Negative for visual disturbance.  Respiratory:  Negative for cough and chest tightness.   Gastrointestinal:  Negative for abdominal pain and nausea.  Genitourinary:  Negative for difficulty urinating, frequency and vaginal pain.  Musculoskeletal:  Negative for back pain and gait problem.  Skin:  Negative for pallor and rash.  Neurological:  Negative for dizziness, tremors, weakness, numbness and headaches.  Psychiatric/Behavioral:  Negative for confusion and sleep disturbance.    Objective:  BP 128/82 (BP Location: Left Arm)   Pulse 73   Temp 98.6 F (37 C) (Oral)   Ht 5\' 5"   (1.651 m)   Wt 187 lb 6.4 oz (85 kg)   SpO2 96%   BMI 31.18 kg/m   BP Readings from Last 3 Encounters:  04/01/21 128/82  01/15/21 (!) 147/84  12/11/20 (!) 146/87    Wt Readings from Last 3 Encounters:  04/01/21 187 lb 6.4 oz (85 kg)  01/15/21 181 lb (82.1 kg)  12/11/20 181 lb (82.1 kg)    Physical Exam Constitutional:      General: She is not in acute distress.    Appearance: She is well-developed. She is obese.  HENT:     Head: Normocephalic.     Right Ear: External ear normal.     Left Ear: External ear normal.     Nose: Nose normal.  Eyes:     General:        Right eye: No discharge.        Left eye: No discharge.     Conjunctiva/sclera: Conjunctivae normal.     Pupils: Pupils are equal, round, and reactive to light.  Neck:     Thyroid: No thyromegaly.     Vascular: No JVD.     Trachea: No tracheal deviation.  Cardiovascular:     Rate and Rhythm: Normal rate and regular rhythm.     Heart sounds: Normal heart sounds.  Pulmonary:     Effort: No respiratory distress.     Breath sounds: No stridor. No wheezing.  Abdominal:     General:  Bowel sounds are normal. There is no distension.     Palpations: Abdomen is soft. There is no mass.     Tenderness: There is no abdominal tenderness. There is no guarding or rebound.  Musculoskeletal:        General: No tenderness.     Cervical back: Normal range of motion and neck supple. No rigidity.  Lymphadenopathy:     Cervical: No cervical adenopathy.  Skin:    Findings: No erythema or rash.  Neurological:     Mental Status: She is oriented to person, place, and time.     Cranial Nerves: No cranial nerve deficit.     Motor: No abnormal muscle tone.     Coordination: Coordination normal.     Deep Tendon Reflexes: Reflexes normal.  Psychiatric:        Behavior: Behavior normal.        Thought Content: Thought content normal.        Judgment: Judgment normal.   I spent 22 minutes in addition to time for CPX wellness  examination in preparing to see the patient by review of recent labs, imaging and procedures, obtaining and reviewing separately obtained history, communicating with the patient, ordering medications, tests or procedures, and documenting clinical information in the EHR including the differential diagnosis, treatment, and any further evaluation and other management of hair loss, elevated glucose, vitamin D and vitamin B12 insufficiency.        Lab Results  Component Value Date   WBC 5.9 09/12/2018   HGB 13.5 09/12/2018   HCT 39.4 09/12/2018   PLT 175.0 09/12/2018   GLUCOSE 100 (H) 09/16/2020   CHOL 179 09/16/2020   TRIG 99 09/16/2020   HDL 45 09/16/2020   LDLCALC 116 (H) 09/16/2020   ALT 14 09/16/2020   AST 17 09/16/2020   NA 140 09/16/2020   K 4.3 09/16/2020   CL 101 09/16/2020   CREATININE 0.81 09/16/2020   BUN 18 09/16/2020   CO2 26 09/16/2020   TSH 2.40 09/12/2018   HGBA1C 5.4 09/16/2020    US Breast Right  Result Date: 02/02/2013 *RADIOLOGY REPORT* Clinical Data:  50 year old female with abnormal screening mammogram - possible right breast mass. DIGITAL DIAGNOSTIC RIGHT MAMMOGRAM  AND RIGHT BREAST ULTRASOUND: Comparison:  01/17/2013 and prior mammograms dating back to 05/23/2008 Findings: ACR Breast Density Category 3: The breast tissue is heterogeneously dense. Spot compression views of the right breast demonstrate minimal increased density in the lower outer right breast, in the area of the screening study finding.  There is no evidence of persistent mass, distortion or worrisome calcifications. On physical exam, no palpable abnormalities identified within the lower outer right breast. Ultrasound is performed, showing no evidence of solid or cystic mass, distortion or abnormal areas of shadowing within the lower outer right breast.  An island of normal fibroglandular tissue is present. IMPRESSION: No suspicious mammographic, palpable or sonographic abnormalities within the lower  outer right breast, in the area of the screening study finding. BI-RADS CATEGORY 1:  Negative. RECOMMENDATION: Bilateral screening mammograms in 1 year. I have discussed the findings and recommendations with the patient. Results were also provided in writing at the conclusion of the visit.  If applicable, a reminder letter will be sent to the patient regarding the next appointment. Original Report Authenticated By: Harmon Pier, M.D.   MM Digital [facility] R  Result Date: 02/02/2013 *RADIOLOGY REPORT* Clinical Data:  50 year old female with abnormal screening mammogram - possible right breast mass. DIGITAL DIAGNOSTIC  RIGHT MAMMOGRAM  AND RIGHT BREAST ULTRASOUND: Comparison:  01/17/2013 and prior mammograms dating back to 05/23/2008 Findings: ACR Breast Density Category 3: The breast tissue is heterogeneously dense. Spot compression views of the right breast demonstrate minimal increased density in the lower outer right breast, in the area of the screening study finding.  There is no evidence of persistent mass, distortion or worrisome calcifications. On physical exam, no palpable abnormalities identified within the lower outer right breast. Ultrasound is performed, showing no evidence of solid or cystic mass, distortion or abnormal areas of shadowing within the lower outer right breast.  An island of normal fibroglandular tissue is present. IMPRESSION: No suspicious mammographic, palpable or sonographic abnormalities within the lower outer right breast, in the area of the screening study finding. BI-RADS CATEGORY 1:  Negative. RECOMMENDATION: Bilateral screening mammograms in 1 year. I have discussed the findings and recommendations with the patient. Results were also provided in writing at the conclusion of the visit.  If applicable, a reminder letter will be sent to the patient regarding the next appointment. Original Report Authenticated By: Harmon Pier, M.D.    Assessment & Plan:     Sonda Primes, MD

## 2021-04-01 NOTE — Assessment & Plan Note (Signed)
Possibly menopausal.  Obtain [facility]

## 2021-04-01 NOTE — Assessment & Plan Note (Signed)
We will need to correct vitamin D and B12 insufficiency.  Optimize thyroid function.

## 2021-04-01 NOTE — Assessment & Plan Note (Addendum)
Doing fairly well on Trintellix-we will continue

## 2021-04-01 NOTE — Assessment & Plan Note (Signed)

## 2021-04-01 NOTE — Assessment & Plan Note (Addendum)
Not taking levothyroxine.  Check TSH, FT4

## 2021-04-01 NOTE — Assessment & Plan Note (Addendum)
Was on Victoza before Mounjaro discussed Obtain hemoglobin A1c.

## 2021-04-01 NOTE — Assessment & Plan Note (Signed)
The patient is now taking B12.  Will start on vitamin B complex daily.  Check CBC

## 2021-06-11 ENCOUNTER — Other Ambulatory Visit: Payer: Self-pay | Admitting: Internal Medicine

## 2021-06-11 MED ORDER — MOLNUPIRAVIR EUA 200MG CAPSULE: 4.0000 | ORAL_CAPSULE | Freq: Two times a day (BID) | ORAL | 0 refills | Status: AC

## 2021-07-09 ENCOUNTER — Encounter: Payer: Self-pay | Admitting: Internal Medicine

## 2021-07-09 ENCOUNTER — Other Ambulatory Visit: Payer: Self-pay

## 2021-07-09 ENCOUNTER — Ambulatory Visit: Payer: 59 | Admitting: Internal Medicine

## 2021-07-09 DIAGNOSIS — R5383 Other fatigue: Secondary | ICD-10-CM | POA: Diagnosis not present

## 2021-07-09 DIAGNOSIS — E559 Vitamin D deficiency, unspecified: Secondary | ICD-10-CM

## 2021-07-09 DIAGNOSIS — E538 Deficiency of other specified B group vitamins: Secondary | ICD-10-CM

## 2021-07-09 DIAGNOSIS — F329 Major depressive disorder, single episode, unspecified: Secondary | ICD-10-CM | POA: Diagnosis not present

## 2021-07-09 DIAGNOSIS — R7303 Prediabetes: Secondary | ICD-10-CM

## 2021-07-09 DIAGNOSIS — Z23 Encounter for immunization: Secondary | ICD-10-CM | POA: Diagnosis not present

## 2021-07-09 DIAGNOSIS — E038 Other specified hypothyroidism: Secondary | ICD-10-CM

## 2021-07-09 MED ORDER — TIRZEPATIDE 5 MG/0.5ML ~~LOC~~ SOAJ: 5.0000 mg | SUBCUTANEOUS | 3 refills | Status: DC

## 2021-07-09 NOTE — Assessment & Plan Note (Addendum)
Post-COVID fatigue discussed.  Rest more, take a vacation Will obtain thyroid test

## 2021-07-09 NOTE — Assessment & Plan Note (Addendum)
On Mounjaro - will go up on the dose.  Continue to monitor weight loss

## 2021-07-09 NOTE — Assessment & Plan Note (Addendum)
On vitamin B complex po

## 2021-07-09 NOTE — Assessment & Plan Note (Addendum)
Cont on Levothyroxine Monitor TSH

## 2021-07-09 NOTE — Assessment & Plan Note (Signed)
Trintellix 20 mg/d

## 2021-07-09 NOTE — Assessment & Plan Note (Signed)
Cont w/Vit D 

## 2021-07-09 NOTE — Progress Notes (Signed)
Subjective:  Patient ID: Morgan Mejia, female    DOB: March 03, 1971  Age: 50 y.o. MRN: [id number]  CC: Follow-up (3 month check)   HPI [name] presents for recent COVID - fatigue and brain fog post-COVID effects. Follow-up on hyperglycemia, hypothyroidism, weight gain   Outpatient Medications Prior to Visit  Medication Sig Dispense Refill   B Complex-Folic Acid (B COMPLEX PLUS) TABS Take 1 tablet by mouth daily. 100 tablet 3   chlorthalidone (HYGROTON) 25 MG tablet Take 1 tablet (25 mg total) by mouth daily. 90 tablet 3   Cholecalciferol (VITAMIN D3) 50 MCG (2000 UT) capsule Take 2 capsules (4,000 Units total) by mouth daily. 100 capsule 3   Insulin Pen Needle (BD PEN NEEDLE NANO 2ND GEN) 32G X 4 MM MISC 1 Package by Does not apply route in the morning and at bedtime. (Patient not taking: Reported on 04/01/2021) 100 each 0   levothyroxine (SYNTHROID) 137 MCG tablet Take 1 tablet (137 mcg total) by mouth daily before breakfast. 30 tablet 0   liraglutide (VICTOZA) 18 MG/3ML SOPN Inject 0.6 mg into the skin daily. (Patient not taking: Reported on 04/01/2021) 3 mL 0   vortioxetine HBr (TRINTELLIX) 20 MG TABS tablet Take 1 tablet (20 mg total) by mouth daily. 90 tablet 3   tirzepatide (MOUNJARO) 2.5 MG/0.5ML Pen Inject 2.5 mg into the skin once a week. 2 mL 2   No facility-administered medications prior to visit.    ROS: Review of Systems  Constitutional:  Positive for fatigue. Negative for activity change, appetite change, chills and unexpected weight change.  HENT:  Negative for congestion, mouth sores and sinus pressure.   Eyes:  Negative for visual disturbance.  Respiratory:  Negative for cough and chest tightness.   Gastrointestinal:  Negative for abdominal pain and nausea.  Genitourinary:  Negative for difficulty urinating, frequency and vaginal pain.  Musculoskeletal:  Negative for back pain and gait problem.  Skin:  Negative for pallor and rash.  Neurological:  Negative  for dizziness, tremors, weakness, numbness and headaches.  Psychiatric/Behavioral:  Positive for decreased concentration. Negative for confusion and sleep disturbance.    Objective:  BP 122/62   Pulse 78   Ht 5\' 5"  (1.651 m)   Wt 177 lb (80.3 kg)   SpO2 95%   BMI 29.45 kg/m   BP Readings from Last 3 Encounters:  07/09/21 122/62  04/01/21 128/82  01/15/21 (!) 147/84    Wt Readings from Last 3 Encounters:  07/09/21 177 lb (80.3 kg)  04/01/21 187 lb 6.4 oz (85 kg)  01/15/21 181 lb (82.1 kg)    Physical Exam Constitutional:      General: She is not in acute distress.    Appearance: She is well-developed.  HENT:     Head: Normocephalic.     Right Ear: External ear normal.     Left Ear: External ear normal.     Nose: Nose normal.  Eyes:     General:        Right eye: No discharge.        Left eye: No discharge.     Conjunctiva/sclera: Conjunctivae normal.     Pupils: Pupils are equal, round, and reactive to light.  Neck:     Thyroid: No thyromegaly.     Vascular: No JVD.     Trachea: No tracheal deviation.  Cardiovascular:     Rate and Rhythm: Normal rate and regular rhythm.     Heart sounds: Normal heart sounds.  Pulmonary:     Effort: No respiratory distress.     Breath sounds: No stridor. No wheezing.  Abdominal:     General: Bowel sounds are normal. There is no distension.     Palpations: Abdomen is soft. There is no mass.     Tenderness: There is no abdominal tenderness. There is no guarding or rebound.  Musculoskeletal:        General: No tenderness.     Cervical back: Normal range of motion and neck supple. No rigidity.  Lymphadenopathy:     Cervical: No cervical adenopathy.  Skin:    Findings: No erythema or rash.  Neurological:     Mental Status: She is oriented to person, place, and time.     Cranial Nerves: No cranial nerve deficit.     Motor: No abnormal muscle tone.     Coordination: Coordination normal.     Deep Tendon Reflexes: Reflexes  normal.  Psychiatric:        Behavior: Behavior normal.        Thought Content: Thought content normal.        Judgment: Judgment normal.    Lab Results  Component Value Date   WBC 4.8 04/01/2021   HGB 13.3 04/01/2021   HCT 39.4 04/01/2021   PLT 167.0 04/01/2021   GLUCOSE 100 (H) 04/01/2021   CHOL 159 04/01/2021   TRIG 70.0 04/01/2021   HDL 49.70 04/01/2021   LDLCALC 95 04/01/2021   ALT 15 04/01/2021   AST 19 04/01/2021   NA 137 04/01/2021   K 4.0 04/01/2021   CL 104 04/01/2021   CREATININE 0.81 04/01/2021   BUN 18 04/01/2021   CO2 27 04/01/2021   TSH 2.75 04/01/2021   HGBA1C 5.5 04/01/2021    US Breast Right  Result Date: 02/02/2013 *RADIOLOGY REPORT* Clinical Data:  50 year old female with abnormal screening mammogram - possible right breast mass. DIGITAL DIAGNOSTIC RIGHT MAMMOGRAM  AND RIGHT BREAST ULTRASOUND: Comparison:  01/17/2013 and prior mammograms dating back to 05/23/2008 Findings: ACR Breast Density Category 3: The breast tissue is heterogeneously dense. Spot compression views of the right breast demonstrate minimal increased density in the lower outer right breast, in the area of the screening study finding.  There is no evidence of persistent mass, distortion or worrisome calcifications. On physical exam, no palpable abnormalities identified within the lower outer right breast. Ultrasound is performed, showing no evidence of solid or cystic mass, distortion or abnormal areas of shadowing within the lower outer right breast.  An island of normal fibroglandular tissue is present. IMPRESSION: No suspicious mammographic, palpable or sonographic abnormalities within the lower outer right breast, in the area of the screening study finding. BI-RADS CATEGORY 1:  Negative. RECOMMENDATION: Bilateral screening mammograms in 1 year. I have discussed the findings and recommendations with the patient. Results were also provided in writing at the conclusion of the visit.  If  applicable, a reminder letter will be sent to the patient regarding the next appointment. Original Report Authenticated By: Margarette Canada, M.D.   MM Digital Diagnostic Unilat R  Result Date: 02/02/2013 *RADIOLOGY REPORT* Clinical Data:  50 year old female with abnormal screening mammogram - possible right breast mass. DIGITAL DIAGNOSTIC RIGHT MAMMOGRAM  AND RIGHT BREAST ULTRASOUND: Comparison:  01/17/2013 and prior mammograms dating back to 05/23/2008 Findings: ACR Breast Density Category 3: The breast tissue is heterogeneously dense. Spot compression views of the right breast demonstrate minimal increased density in the lower outer right breast, in the area of the screening  study finding.  There is no evidence of persistent mass, distortion or worrisome calcifications. On physical exam, no palpable abnormalities identified within the lower outer right breast. Ultrasound is performed, showing no evidence of solid or cystic mass, distortion or abnormal areas of shadowing within the lower outer right breast.  An island of normal fibroglandular tissue is present. IMPRESSION: No suspicious mammographic, palpable or sonographic abnormalities within the lower outer right breast, in the area of the screening study finding. BI-RADS CATEGORY 1:  Negative. RECOMMENDATION: Bilateral screening mammograms in 1 year. I have discussed the findings and recommendations with the patient. Results were also provided in writing at the conclusion of the visit.  If applicable, a reminder letter will be sent to the patient regarding the next appointment. Original Report Authenticated By: Margarette Canada, M.D.    Assessment & Plan:   Problem List Items Addressed This Visit     B12 deficiency    On vitamin B complex po      Depression     Trintellix 20 mg/d      Fatigue    Post-COVID fatigue discussed.  Rest more, take a vacation Will obtain thyroid test      Hypothyroidism    Cont on Levothyroxine Monitor TSH       Prediabetes    On Mounjaro - will go up on the dose.  Continue to monitor weight loss      Vitamin D deficiency    Cont w/Vit D         Meds ordered this encounter  Medications   tirzepatide (MOUNJARO) 5 MG/0.5ML Pen    Sig: Inject 5 mg into the skin once a week.    Dispense:  6 mL    Refill:  3      Follow-up: Return in about 2 months (around 09/08/2021) for a follow-up visit.  Walker Kehr, MD

## 2021-09-17 ENCOUNTER — Other Ambulatory Visit: Payer: Self-pay | Admitting: Internal Medicine

## 2021-09-17 DIAGNOSIS — E039 Hypothyroidism, unspecified: Secondary | ICD-10-CM

## 2021-12-16 ENCOUNTER — Other Ambulatory Visit: Payer: Self-pay | Admitting: Internal Medicine

## 2022-01-06 ENCOUNTER — Telehealth: Payer: Self-pay | Admitting: *Deleted

## 2022-01-06 NOTE — Telephone Encounter (Signed)
Rec'd PA for pt Mounjaro completed on cover-my-meds w/ (Key: [id number]). Rec'd msg stating " Please wait for [employer] 2017 to return a determination".Marland KitchenRaechel Chute ?

## 2022-01-07 NOTE — Telephone Encounter (Signed)
Rec'd determination fax baack med was not approved. It states not covered. The alternatives are Ozempic, Rybelsus, Trulicity, or victoza.Marland KitchenRaechel Chute ?

## 2022-01-08 NOTE — Telephone Encounter (Signed)
Please schedule office visit to discuss alternatives.  Thanks ?

## 2022-01-08 NOTE — Telephone Encounter (Signed)
Tried calling pt there was no answer. Sent pt a mychart msg letting her PA was denied and MD response.Marland KitchenRaechel Mejia ?

## 2022-01-08 NOTE — Addendum Note (Signed)
Addended by: Tresa Garter on: 01/08/2022 08:06 AM ? ? Modules accepted: Orders ? ?

## 2022-03-11 ENCOUNTER — Ambulatory Visit: Payer: 59 | Admitting: Internal Medicine

## 2022-03-17 ENCOUNTER — Ambulatory Visit: Payer: 59 | Admitting: Internal Medicine

## 2022-04-05 ENCOUNTER — Other Ambulatory Visit: Payer: Self-pay | Admitting: Internal Medicine

## 2022-04-07 ENCOUNTER — Ambulatory Visit: Payer: 59 | Admitting: Internal Medicine

## 2022-04-14 ENCOUNTER — Encounter (INDEPENDENT_AMBULATORY_CARE_PROVIDER_SITE_OTHER): Payer: Self-pay

## 2022-04-22 ENCOUNTER — Other Ambulatory Visit: Payer: Self-pay | Admitting: Internal Medicine

## 2022-04-29 ENCOUNTER — Ambulatory Visit: Payer: 59 | Admitting: Internal Medicine

## 2022-04-29 ENCOUNTER — Encounter: Payer: Self-pay | Admitting: Internal Medicine

## 2022-04-29 VITALS — BP 118/80 | HR 78 | Temp 98.5°F | Ht 65.0 in | Wt 170.2 lb

## 2022-04-29 DIAGNOSIS — E538 Deficiency of other specified B group vitamins: Secondary | ICD-10-CM | POA: Diagnosis not present

## 2022-04-29 DIAGNOSIS — Z0000 Encounter for general adult medical examination without abnormal findings: Secondary | ICD-10-CM

## 2022-04-29 DIAGNOSIS — Z136 Encounter for screening for cardiovascular disorders: Secondary | ICD-10-CM | POA: Diagnosis not present

## 2022-04-29 DIAGNOSIS — R739 Hyperglycemia, unspecified: Secondary | ICD-10-CM

## 2022-04-29 DIAGNOSIS — E038 Other specified hypothyroidism: Secondary | ICD-10-CM | POA: Diagnosis not present

## 2022-04-29 DIAGNOSIS — R7303 Prediabetes: Secondary | ICD-10-CM

## 2022-04-29 DIAGNOSIS — E559 Vitamin D deficiency, unspecified: Secondary | ICD-10-CM

## 2022-04-29 DIAGNOSIS — F329 Major depressive disorder, single episode, unspecified: Secondary | ICD-10-CM

## 2022-04-29 LAB — COMPREHENSIVE METABOLIC PANEL
ALT: 15 U/L (ref 0–35)
AST: 20 U/L (ref 0–37)
Albumin: 4.6 g/dL (ref 3.5–5.2)
Alkaline Phosphatase: 49 U/L (ref 39–117)
BUN: 13 mg/dL (ref 6–23)
CO2: 32 mEq/L (ref 19–32)
Calcium: 9.7 mg/dL (ref 8.4–10.5)
Chloride: 99 mEq/L (ref 96–112)
Creatinine, Ser: 0.76 mg/dL (ref 0.40–1.20)
GFR: 91.2 mL/min (ref >60.00)
Glucose, Bld: 95 mg/dL (ref 70–99)
Potassium: 3.8 mEq/L (ref 3.5–5.1)
Sodium: 138 mEq/L (ref 135–145)
Total Bilirubin: 0.5 mg/dL (ref 0.2–1.2)
Total Protein: 7.7 g/dL (ref 6.0–8.3)

## 2022-04-29 LAB — LIPID PANEL
Cholesterol: 155 mg/dL (ref 0–200)
HDL: 54.7 mg/dL (ref >39.00)
LDL Cholesterol: 83 mg/dL (ref 0–99)
NonHDL: 99.9
Total CHOL/HDL Ratio: 3
Triglycerides: 86 mg/dL (ref 0.0–149.0)
VLDL: 17.2 mg/dL (ref 0.0–40.0)

## 2022-04-29 LAB — URINALYSIS
Bilirubin Urine: NEGATIVE
Ketones, ur: NEGATIVE
Leukocytes,Ua: NEGATIVE
Nitrite: NEGATIVE
Specific Gravity, Urine: 1.015 (ref 1.000–1.030)
Total Protein, Urine: NEGATIVE
Urine Glucose: NEGATIVE
Urobilinogen, UA: 0.2 (ref 0.0–1.0)
pH: 7.5 (ref 5.0–8.0)

## 2022-04-29 LAB — CBC WITH DIFFERENTIAL/PLATELET
Basophils Absolute: 0 10*3/uL (ref 0.0–0.1)
Basophils Relative: 0.6 % (ref 0.0–3.0)
Eosinophils Absolute: 0.2 10*3/uL (ref 0.0–0.7)
Eosinophils Relative: 2.9 % (ref 0.0–5.0)
HCT: 40.7 % (ref 36.0–46.0)
Hemoglobin: 13.9 g/dL (ref 12.0–15.0)
Lymphocytes Relative: 31.8 % (ref 12.0–46.0)
Lymphs Abs: 1.8 10*3/uL (ref 0.7–4.0)
MCHC: 34.2 g/dL (ref 30.0–36.0)
MCV: 92 fl (ref 78.0–100.0)
Monocytes Absolute: 0.6 10*3/uL (ref 0.1–1.0)
Monocytes Relative: 10.8 % (ref 3.0–12.0)
Neutro Abs: 3.1 10*3/uL (ref 1.4–7.7)
Neutrophils Relative %: 53.9 % (ref 43.0–77.0)
Platelets: 203 10*3/uL (ref 150.0–400.0)
RBC: 4.43 Mil/uL (ref 3.87–5.11)
RDW: 12.9 % (ref 11.5–15.5)
WBC: 5.7 10*3/uL (ref 4.0–10.5)

## 2022-04-29 LAB — VITAMIN B12: Vitamin B-12: 326 pg/mL (ref 211–911)

## 2022-04-29 LAB — TSH: TSH: 1.44 u[IU]/mL (ref 0.35–5.50)

## 2022-04-29 LAB — T4, FREE: Free T4: 1.39 ng/dL (ref 0.60–1.60)

## 2022-04-29 LAB — VITAMIN D 25 HYDROXY (VIT D DEFICIENCY, FRACTURES): VITD: 54.74 ng/mL (ref 30.00–100.00)

## 2022-04-29 LAB — HEMOGLOBIN A1C: Hgb A1c MFr Bld: 5.5 % (ref 4.6–6.5)

## 2022-04-29 MED ORDER — VORTIOXETINE HBR 20 MG PO TABS: 20.0000 mg | ORAL_TABLET | Freq: Every day | ORAL | 1 refills | Status: DC

## 2022-04-29 MED ORDER — TIRZEPATIDE 2.5 MG/0.5ML ~~LOC~~ SOAJ: 2.5000 mg | SUBCUTANEOUS | 3 refills | Status: DC

## 2022-04-29 NOTE — Assessment & Plan Note (Addendum)
Pre-DM Check A1c C/o nausea and occasional vomiting w/5 mg Mounjaro weekly. Other therapeutic options, i.e. Rybelsus, Ozempic were discussed.  She would like to continue with Mounjaro. We will continue on Mounjaro -the dose was reduced due to nausea

## 2022-04-29 NOTE — Assessment & Plan Note (Addendum)
On vit D Check vitamin D level today

## 2022-04-29 NOTE — Assessment & Plan Note (Addendum)
C/o nausea and occasional vomiting w/5 mg Mounjaro weekly. Other therapeutic options, i.e. Rybelsus, Ozempic were discussed.  She would like to continue with Mounjaro. We will continue on Mounjaro -the dose was reduced due to nausea

## 2022-04-29 NOTE — Assessment & Plan Note (Addendum)
On B complex Check B12 level

## 2022-04-29 NOTE — Assessment & Plan Note (Addendum)
Follow-up with Dr. Cleon Gustin.  Check FT4, TSH today

## 2022-04-29 NOTE — Assessment & Plan Note (Signed)
Cont on Trintellix 20 mg/d

## 2022-04-29 NOTE — Progress Notes (Signed)
Subjective:  Patient ID: Morgan Mejia, female    DOB: May 18, 1971  Age: 51 y.o. MRN: [id number]  CC: Follow-up (6 month f/u) and Medication Refill (Need trintellix)   HPI Morgan Mejia presents for pre-diabetes, obesity. C/o nausea and occasional vomiting w/5 mg Mounjaro weekly. F/u depression, hypothyroidism. Pt lost 20 lbs on Mounjaro   Outpatient Medications Prior to Visit  Medication Sig Dispense Refill   B Complex-Folic Acid (B COMPLEX PLUS) TABS Take 1 tablet by mouth daily. 100 tablet 3   chlorthalidone (HYGROTON) 25 MG tablet Take 1 tablet (25 mg total) by mouth daily. Annual appt due must see provider for future refills 30 tablet 0   Cholecalciferol (VITAMIN D3) 50 MCG (2000 UT) capsule Take 2 capsules (4,000 Units total) by mouth daily. 100 capsule 3   levothyroxine (SYNTHROID) 137 MCG tablet Take 1 tablet (137 mcg total) by mouth daily before breakfast. 30 tablet 0   vortioxetine HBr (TRINTELLIX) 20 MG TABS tablet Take 1 tablet (20 mg total) by mouth daily. Overdue for Annual appt due in must see provider for future refills 30 tablet 0   Insulin Pen Needle (BD PEN NEEDLE NANO 2ND GEN) 32G X 4 MM MISC 1 Package by Does not apply route in the morning and at bedtime. (Patient not taking: Reported on 04/01/2021) 100 each 0   No facility-administered medications prior to visit.    ROS: Review of Systems  Constitutional:  Negative for activity change, appetite change, chills, fatigue and unexpected weight change.  HENT:  Negative for congestion, mouth sores and sinus pressure.   Eyes:  Negative for visual disturbance.  Respiratory:  Negative for cough and chest tightness.   Gastrointestinal:  Negative for abdominal pain and nausea.  Genitourinary:  Negative for difficulty urinating, frequency and vaginal pain.  Musculoskeletal:  Negative for back pain and gait problem.  Skin:  Negative for pallor and rash.  Neurological:  Negative for dizziness, tremors, weakness,  numbness and headaches.  Psychiatric/Behavioral:  Negative for confusion and sleep disturbance.     Objective:  BP 118/80 (BP Location: Left Arm)   Pulse 78   Temp 98.5 F (36.9 C) (Oral)   Ht 5\' 5"  (1.651 m)   Wt 170 lb 3.2 oz (77.2 kg)   SpO2 95%   BMI 28.32 kg/m   BP Readings from Last 3 Encounters:  04/29/22 118/80  07/09/21 122/62  04/01/21 128/82    Wt Readings from Last 3 Encounters:  04/29/22 170 lb 3.2 oz (77.2 kg)  07/09/21 177 lb (80.3 kg)  04/01/21 187 lb 6.4 oz (85 kg)    Physical Exam Constitutional:      General: She is not in acute distress.    Appearance: She is well-developed.  HENT:     Head: Normocephalic.     Right Ear: External ear normal.     Left Ear: External ear normal.     Nose: Nose normal.  Eyes:     General:        Right eye: No discharge.        Left eye: No discharge.     Conjunctiva/sclera: Conjunctivae normal.     Pupils: Pupils are equal, round, and reactive to light.  Neck:     Thyroid: No thyromegaly.     Vascular: No JVD.     Trachea: No tracheal deviation.  Cardiovascular:     Rate and Rhythm: Normal rate and regular rhythm.     Heart sounds: Normal heart sounds.  Pulmonary:  Effort: No respiratory distress.     Breath sounds: No stridor. No wheezing.  Abdominal:     General: Bowel sounds are normal. There is no distension.     Palpations: Abdomen is soft. There is no mass.     Tenderness: There is no abdominal tenderness. There is no guarding or rebound.  Musculoskeletal:        General: No tenderness.     Cervical back: Normal range of motion and neck supple. No rigidity.  Lymphadenopathy:     Cervical: No cervical adenopathy.  Skin:    Findings: No erythema or rash.  Neurological:     Cranial Nerves: No cranial nerve deficit.     Motor: No abnormal muscle tone.     Coordination: Coordination normal.     Deep Tendon Reflexes: Reflexes normal.  Psychiatric:        Behavior: Behavior normal.        Thought  Content: Thought content normal.        Judgment: Judgment normal.     Lab Results  Component Value Date   WBC 4.8 04/01/2021   HGB 13.3 04/01/2021   HCT 39.4 04/01/2021   PLT 167.0 04/01/2021   GLUCOSE 100 (H) 04/01/2021   CHOL 159 04/01/2021   TRIG 70.0 04/01/2021   HDL 49.70 04/01/2021   LDLCALC 95 04/01/2021   ALT 15 04/01/2021   AST 19 04/01/2021   NA 137 04/01/2021   K 4.0 04/01/2021   CL 104 04/01/2021   CREATININE 0.81 04/01/2021   BUN 18 04/01/2021   CO2 27 04/01/2021   TSH 2.75 04/01/2021   HGBA1C 5.5 04/01/2021    MM Digital Diagnostic Unilat R  Result Date: 02/02/2013 *RADIOLOGY REPORT* Clinical Data:  51 year old female with abnormal screening mammogram - possible right breast mass. DIGITAL DIAGNOSTIC RIGHT MAMMOGRAM  AND RIGHT BREAST ULTRASOUND: Comparison:  01/17/2013 and prior mammograms dating back to 05/23/2008 Findings: ACR Breast Density Category 3: The breast tissue is heterogeneously dense. Spot compression views of the right breast demonstrate minimal increased density in the lower outer right breast, in the area of the screening study finding.  There is no evidence of persistent mass, distortion or worrisome calcifications. On physical exam, no palpable abnormalities identified within the lower outer right breast. Ultrasound is performed, showing no evidence of solid or cystic mass, distortion or abnormal areas of shadowing within the lower outer right breast.  An island of normal fibroglandular tissue is present. IMPRESSION: No suspicious mammographic, palpable or sonographic abnormalities within the lower outer right breast, in the area of the screening study finding. BI-RADS CATEGORY 1:  Negative. RECOMMENDATION: Bilateral screening mammograms in 1 year. I have discussed the findings and recommendations with the patient. Results were also provided in writing at the conclusion of the visit.  If applicable, a reminder letter will be sent to the patient regarding  the next appointment. Original Report Authenticated By: Harmon Pier, M.D.   US Breast Right  Result Date: 02/02/2013 *RADIOLOGY REPORT* Clinical Data:  52 year old female with abnormal screening mammogram - possible right breast mass. DIGITAL DIAGNOSTIC RIGHT MAMMOGRAM  AND RIGHT BREAST ULTRASOUND: Comparison:  01/17/2013 and prior mammograms dating back to 05/23/2008 Findings: ACR Breast Density Category 3: The breast tissue is heterogeneously dense. Spot compression views of the right breast demonstrate minimal increased density in the lower outer right breast, in the area of the screening study finding.  There is no evidence of persistent mass, distortion or worrisome calcifications. On physical exam, no  palpable abnormalities identified within the lower outer right breast. Ultrasound is performed, showing no evidence of solid or cystic mass, distortion or abnormal areas of shadowing within the lower outer right breast.  An island of normal fibroglandular tissue is present. IMPRESSION: No suspicious mammographic, palpable or sonographic abnormalities within the lower outer right breast, in the area of the screening study finding. BI-RADS CATEGORY 1:  Negative. RECOMMENDATION: Bilateral screening mammograms in 1 year. I have discussed the findings and recommendations with the patient. Results were also provided in writing at the conclusion of the visit.  If applicable, a reminder letter will be sent to the patient regarding the next appointment. Original Report Authenticated By: Harmon Pier, M.D.    Assessment & Plan:   Problem List Items Addressed This Visit     B12 deficiency    On B complex Check B12 level      Relevant Orders   Vitamin B12   Depression    Cont on Trintellix 20 mg/d      Relevant Medications   vortioxetine HBr (TRINTELLIX) 20 MG TABS tablet   Hyperglycemia    Pre-DM Check A1c C/o nausea and occasional vomiting w/5 mg Mounjaro weekly. Other therapeutic options, i.e.  Rybelsus, Ozempic were discussed.  She would like to continue with Mounjaro. We will continue on Mounjaro -the dose was reduced due to nausea      Hypothyroidism    Follow-up with Dr. Cleon Gustin.  Check FT4, TSH today      Prediabetes    C/o nausea and occasional vomiting w/5 mg Mounjaro weekly. Other therapeutic options, i.e. Rybelsus, Ozempic were discussed.  She would like to continue with Mounjaro. We will continue on Mounjaro -the dose was reduced due to nausea      Vitamin D deficiency    On vit D Check vitamin D level today      Relevant Orders   VITAMIN D 25 Hydroxy (Vit-D Deficiency, Fractures)   Well adult exam - Primary   Relevant Orders   TSH   Urinalysis   CBC with Differential/Platelet   Lipid panel   Comprehensive metabolic panel   Hemoglobin A1c   T4, free      Meds ordered this encounter  Medications   vortioxetine HBr (TRINTELLIX) 20 MG TABS tablet    Sig: Take 1 tablet (20 mg total) by mouth daily.    Dispense:  90 tablet    Refill:  1   tirzepatide (MOUNJARO) 2.5 MG/0.5ML Pen    Sig: Inject 2.5 mg into the skin once a week.    Dispense:  2 mL    Refill:  3      Follow-up: Return in about 3 months (around 07/30/2022) for a follow-up visit.  Sonda Primes, MD

## 2022-05-05 ENCOUNTER — Other Ambulatory Visit: Payer: Self-pay | Admitting: Internal Medicine

## 2022-05-21 ENCOUNTER — Encounter: Payer: Self-pay | Admitting: Internal Medicine

## 2022-10-16 ENCOUNTER — Other Ambulatory Visit: Payer: Self-pay | Admitting: Internal Medicine

## 2022-12-07 ENCOUNTER — Other Ambulatory Visit: Payer: Self-pay | Admitting: Internal Medicine

## 2023-04-11 ENCOUNTER — Telehealth: Payer: Self-pay

## 2023-04-11 ENCOUNTER — Other Ambulatory Visit (HOSPITAL_COMMUNITY): Payer: Self-pay

## 2023-04-11 NOTE — Telephone Encounter (Signed)
Pharmacy Patient Advocate Encounter   Received notification from CoverMyMeds that prior authorization for [facility] 2.5mg /0.68ml is required/requested.   Insurance verification completed.   The patient is insured through [employer] [facility] .   Per test claim: PA required; PA started via CoverMyMeds. KEY [id number] . Waiting for clinical questions to populate.

## 2023-04-12 ENCOUNTER — Other Ambulatory Visit (HOSPITAL_COMMUNITY): Payer: Self-pay

## 2023-04-12 NOTE — Telephone Encounter (Signed)
Clinical questions answered and submitted PA

## 2023-04-14 NOTE — Telephone Encounter (Signed)
Pharmacy Patient Advocate Encounter  Received notification from [employer] that Prior Authorization for [facility] 2.5mg /0.53ml has been DENIED. Please advise how you'd like to proceed. Full denial letter will be uploaded to the media tab. See denial reason below.   PA #/Case ID/Reference #: [id number]

## 2023-04-15 ENCOUNTER — Encounter: Payer: Self-pay | Admitting: Gastroenterology

## 2023-05-26 ENCOUNTER — Ambulatory Visit (AMBULATORY_SURGERY_CENTER): Payer: 59

## 2023-05-26 VITALS — Ht 65.0 in | Wt 180.0 lb

## 2023-05-26 DIAGNOSIS — Z1211 Encounter for screening for malignant neoplasm of colon: Secondary | ICD-10-CM

## 2023-05-26 MED ORDER — NA SULFATE-K SULFATE-MG SULF 17.5-3.13-1.6 GM/177ML PO SOLN: 1.0000 | Freq: Once | ORAL | 0 refills | Status: AC

## 2023-05-26 NOTE — Progress Notes (Addendum)
Pre visit completed via phone call; Patient verified name, DOB, and address; No egg or soy allergy known to patient;  No issues known to pt with past sedation with any surgeries or procedures----other than PONV; Patient denies ever being told they had issues or difficulty with intubation----other than PONV;  No FH of Malignant Hyperthermia; Pt is not on diet pills; Pt is not on home 02;  Pt is not on blood thinners;  Pt reports issues with constipation-patient reports she is having issues with having a bowel movement everyday but reports this is because of not consuming enough fluids; patient advised to increase oral fluids, activity as tolerated, fruits/veggies that are allowed prior to prep and also to take OTC PRN stool softeners or laxatives if needed;   No A fib or A flutter; Have any cardiac testing pending--NO Insurance verified during PV appt--- UHC Pt can ambulate without assistance;  Pt denies use of chewing tobacco; Discussed diabetic/weight loss medication holds; Discussed NSAID holds; Checked BMI to be less than 50; Pt instructed to use Singlecare[employer]com or GoodRx for a price reduction on prep;  Patient's chart reviewed by Cathlyn Parsons CNRA prior to previsit and patient appropriate for the [facility]; Pre visit completed and red dot placed by patient's name on their procedure day (on provider's schedule).    Instructions sent to MyChart per patient request;

## 2023-06-09 ENCOUNTER — Encounter: Payer: 59 | Admitting: Gastroenterology

## 2023-06-10 ENCOUNTER — Other Ambulatory Visit: Payer: Self-pay | Admitting: Internal Medicine

## 2023-06-10 DIAGNOSIS — Z1212 Encounter for screening for malignant neoplasm of rectum: Secondary | ICD-10-CM

## 2023-06-10 DIAGNOSIS — Z1211 Encounter for screening for malignant neoplasm of colon: Secondary | ICD-10-CM

## 2023-06-11 ENCOUNTER — Other Ambulatory Visit: Payer: Self-pay | Admitting: Internal Medicine

## 2023-06-15 ENCOUNTER — Encounter: Payer: Self-pay | Admitting: Gastroenterology

## 2023-06-29 ENCOUNTER — Ambulatory Visit (AMBULATORY_SURGERY_CENTER): Payer: BC Managed Care – PPO | Admitting: Gastroenterology

## 2023-06-29 ENCOUNTER — Encounter: Payer: Self-pay | Admitting: Gastroenterology

## 2023-06-29 VITALS — BP 131/76 | HR 56 | Temp 97.7°F | Resp 14 | Ht 65.0 in | Wt 180.0 lb

## 2023-06-29 DIAGNOSIS — K5730 Diverticulosis of large intestine without perforation or abscess without bleeding: Secondary | ICD-10-CM

## 2023-06-29 DIAGNOSIS — Z1211 Encounter for screening for malignant neoplasm of colon: Secondary | ICD-10-CM | POA: Diagnosis not present

## 2023-06-29 MED ORDER — SODIUM CHLORIDE 0.9 % IV SOLN: 500.0000 mL | INTRAVENOUS | Status: DC

## 2023-06-29 NOTE — Progress Notes (Signed)
Pt's states no medical or surgical changes since previsit or office visit. 

## 2023-06-29 NOTE — Patient Instructions (Signed)
Please read handouts provided. Continue present medications. Repeat colonoscopy in 10 years for screening. Return to GI office as needed.   YOU HAD AN ENDOSCOPIC PROCEDURE TODAY AT [facility]:   Refer to the procedure report that was given to you for any specific questions about what was found during the examination.  If the procedure report does not answer your questions, please call your gastroenterologist to clarify.  If you requested that your care partner not be given the details of your procedure findings, then the procedure report has been included in a sealed envelope for you to review at your convenience later.  YOU SHOULD EXPECT: Some feelings of bloating in the abdomen. Passage of more gas than usual.  Walking can help get rid of the air that was put into your GI tract during the procedure and reduce the bloating. If you had a lower endoscopy (such as a colonoscopy or flexible sigmoidoscopy) you may notice spotting of blood in your stool or on the toilet paper. If you underwent a bowel prep for your procedure, you may not have a normal bowel movement for a few days.  Please Note:  You might notice some irritation and congestion in your nose or some drainage.  This is from the oxygen used during your procedure.  There is no need for concern and it should clear up in a day or so.  SYMPTOMS TO REPORT IMMEDIATELY:  Following lower endoscopy (colonoscopy or flexible sigmoidoscopy):  Excessive amounts of blood in the stool  Significant tenderness or worsening of abdominal pains  Swelling of the abdomen that is new, acute  Fever of 100F or higher  For urgent or emergent issues, a gastroenterologist can be reached at any hour by calling [phone]. Do not use MyChart messaging for urgent concerns.    DIET:  We do recommend a small meal at first, but then you may proceed to your regular diet.  Drink plenty of fluids but you should avoid alcoholic beverages for 24  hours.  ACTIVITY:  You should plan to take it easy for the rest of today and you should NOT DRIVE or use heavy machinery until tomorrow (because of the sedation medicines used during the test).    FOLLOW UP: Our staff will call the number listed on your records the next business day following your procedure.  We will call around 7:15- 8:00 am to check on you and address any questions or concerns that you may have regarding the information given to you following your procedure. If we do not reach you, we will leave a message.     If any biopsies were taken you will be contacted by phone or by letter within the next 1-3 weeks.  Please call us at [phone] if you have not heard about the biopsies in 3 weeks.    SIGNATURES/CONFIDENTIALITY: You and/or your care partner have signed paperwork which will be entered into your electronic medical record.  These signatures attest to the fact that that the information above on your After Visit Summary has been reviewed and is understood.  Full responsibility of the confidentiality of this discharge information lies with you and/or your care-partner.

## 2023-06-29 NOTE — Progress Notes (Signed)
Vss nad trans to pacu 

## 2023-06-29 NOTE — Op Note (Signed)
[facility] Patient Name: Morgan Mejia Procedure Date: 06/29/2023 9:30 AM MRN: [id number] Endoscopist: Doristine Locks , MD, [id number] Age: 52 Referring MD:  Date of Birth: 1971-05-03 Gender: Female Account #: [id number] Procedure:                Colonoscopy Indications:              Screening for colorectal malignant neoplasm, This                            is the patient's first colonoscopy Medicines:                Monitored Anesthesia Care Procedure:                Pre-Anesthesia Assessment:                           - Prior to the procedure, a History and Physical                            was performed, and patient medications and                            allergies were reviewed. The patient's tolerance of                            previous anesthesia was also reviewed. The risks                            and benefits of the procedure and the sedation                            options and risks were discussed with the patient.                            All questions were answered, and informed consent                            was obtained. Prior Anticoagulants: The patient has                            taken no anticoagulant or antiplatelet agents. ASA                            Grade Assessment: II - A patient with mild systemic                            disease. After reviewing the risks and benefits,                            the patient was deemed in satisfactory condition to                            undergo the procedure.  After obtaining informed consent, the colonoscope                            was passed under direct vision. Throughout the                            procedure, the patient's blood pressure, pulse, and                            oxygen saturations were monitored continuously. The                            CF [id number] #[id number] was introduced through the anus                            and advanced to  the the terminal ileum. The                            colonoscopy was performed without difficulty. The                            patient tolerated the procedure well. The quality                            of the bowel preparation was good. The terminal                            ileum, ileocecal valve, appendiceal orifice, and                            rectum were photographed. Scope In: 9:49:59 AM Scope Out: 10:03:28 AM Scope Withdrawal Time: 0 hours 9 minutes 23 seconds  Total Procedure Duration: 0 hours 13 minutes 29 seconds  Findings:                 The perianal and digital rectal examinations were                            normal.                           Multiple medium-mouthed and small-mouthed                            diverticula were found in the sigmoid colon and                            ascending colon.                           The exam was otherwise normal throughout the                            remainder of the colon.  The retroflexed view of the distal rectum and anal                            verge was normal and showed no anal or rectal                            abnormalities.                           The terminal ileum appeared normal. Complications:            No immediate complications. Estimated Blood Loss:     Estimated blood loss: none. Impression:               - Diverticulosis in the sigmoid colon and in the                            ascending colon.                           - The distal rectum and anal verge are normal on                            retroflexion view.                           - The examined portion of the ileum was normal.                           - No specimens collected. Recommendation:           - Patient has a contact number available for                            emergencies. The signs and symptoms of potential                            delayed complications were discussed with the                             patient. Return to normal activities tomorrow.                            Written discharge instructions were provided to the                            patient.                           - Resume previous diet.                           - Continue present medications.                           - Repeat colonoscopy in 10 years for screening  purposes.                           - Return to GI office PRN. Doristine Locks, MD 06/29/2023 10:07:37 AM

## 2023-06-29 NOTE — Progress Notes (Signed)
GASTROENTEROLOGY PROCEDURE H&P NOTE   Primary Care Physician: Tresa Garter, MD    Reason for Procedure:  Colon Cancer screening  Plan:    Colonoscopy  Patient is appropriate for endoscopic procedure(s) in the ambulatory ([facility]) setting.  The nature of the procedure, as well as the risks, benefits, and alternatives were carefully and thoroughly reviewed with the patient. Ample time for discussion and questions allowed. The patient understood, was satisfied, and agreed to proceed.     HPI: Morgan Mejia is a 52 y.o. female who presents for colonoscopy for routine Colon Cancer screening.  No active GI symptoms.  No known family history of colon cancer or related malignancy.  Patient is otherwise without complaints or active issues today.  Past Medical History:  Diagnosis Date   Anxiety    B12 deficiency    Back pain    Chicken pox    Depression    on meds   GERD (gastroesophageal reflux disease)    hx of   Hypothyroidism    on meds   PONV (postoperative nausea and vomiting)    Swelling    Vitamin D deficiency     Past Surgical History:  Procedure Laterality Date   Bone Spur Right 1999   WISDOM TOOTH EXTRACTION  1990    Prior to Admission medications   Medication Sig Start Date End Date Taking? Authorizing Provider  B Complex-Folic Acid (B COMPLEX PLUS) TABS Take 1 tablet by mouth daily. 04/01/21  Yes Plotnikov, Georgina Quint, MD  chlorthalidone (HYGROTON) 25 MG tablet TAKE 1 TABLET (25 MG TOTAL) BY MOUTH DAILY. 06/11/23  Yes Plotnikov, Georgina Quint, MD  Cholecalciferol (VITAMIN D3) 50 MCG (2000 UT) capsule Take 2 capsules (4,000 Units total) by mouth daily. 04/01/21  Yes Plotnikov, Georgina Quint, MD  levothyroxine (SYNTHROID) 137 MCG tablet Take 1 tablet (137 mcg total) by mouth daily before breakfast. 03/19/20  Yes Langston Reusing, MD  TRINTELLIX 20 MG TABS tablet TAKE 1 TABLET BY MOUTH EVERY DAY 12/07/22  Yes Plotnikov, Georgina Quint, MD    Current Outpatient  Medications  Medication Sig Dispense Refill   B Complex-Folic Acid (B COMPLEX PLUS) TABS Take 1 tablet by mouth daily. 100 tablet 3   chlorthalidone (HYGROTON) 25 MG tablet TAKE 1 TABLET (25 MG TOTAL) BY MOUTH DAILY. 90 tablet 3   Cholecalciferol (VITAMIN D3) 50 MCG (2000 UT) capsule Take 2 capsules (4,000 Units total) by mouth daily. 100 capsule 3   levothyroxine (SYNTHROID) 137 MCG tablet Take 1 tablet (137 mcg total) by mouth daily before breakfast. 30 tablet 0   TRINTELLIX 20 MG TABS tablet TAKE 1 TABLET BY MOUTH EVERY DAY 90 tablet 1   Current Facility-Administered Medications  Medication Dose Route Frequency Provider Last Rate Last Admin   0.9 %  sodium chloride infusion  500 mL Intravenous Continuous Kdyn Vonbehren V, DO        Allergies as of 06/29/2023 - Review Complete 06/29/2023  Allergen Reaction Noted   Aspirin Swelling 09/29/2015   Wellbutrin [bupropion] Hives, Itching, Swelling, Dermatitis, and Rash 04/14/2018    Family History  Problem Relation Age of Onset   Colon polyps Mother 35   Diabetes Mother    Hyperlipidemia Mother    Uterine cancer Mother 70   Pernicious anemia Mother    Hypertension Mother    Thyroid disease Mother    Depression Mother    Anxiety disorder Mother    Obesity Mother    Alcoholism Father  Leukemia Father 86   Pernicious anemia Maternal Aunt    Colon polyps Maternal Uncle 60   Colon polyps Maternal Grandfather 70   Throat cancer Maternal Grandfather 69       smoker   Colon cancer Neg Hx    Esophageal cancer Neg Hx    Rectal cancer Neg Hx    Stomach cancer Neg Hx     Social History   Socioeconomic History   Marital status: Married    Spouse name: Cherub Wohlert   Number of children: 2   Years of education: Not on file   Highest education level: Not on file  Occupational History   Occupation: [employer] pilot  Tobacco Use   Smoking status: Former   Smokeless tobacco: Never  Vaping Use   Vaping status: Never Used   Substance and Sexual Activity   Alcohol use: Yes    Alcohol/week: 5.0 standard drinks of alcohol    Types: 5 Standard drinks or equivalent per week   Drug use: Never   Sexual activity: Not on file  Other Topics Concern   Not on file  Social History Narrative   Not on file   Social Determinants of Health   Financial Resource Strain: Not on file  Food Insecurity: Not on file  Transportation Needs: Not on file  Physical Activity: Not on file  Stress: Not on file  Social Connections: Not on file  Intimate Partner Violence: Not on file    Physical Exam: Vital signs in last 24 hours: @BP  139/78   Pulse 64   Temp 97.7 F (36.5 C)   Resp 15   Ht 5\' 5"  (1.651 m)   Wt 180 lb (81.6 kg)   SpO2 98%   BMI 29.95 kg/m  GEN: NAD EYE: Sclerae anicteric ENT: MMM CV: Non-tachycardic Pulm: CTA b/l GI: Soft, NT/ND NEURO:  Alert & Oriented x 3   Doristine Locks, DO [facility]   06/29/2023 9:39 AM

## 2023-06-30 ENCOUNTER — Telehealth: Payer: Self-pay

## 2023-06-30 NOTE — Telephone Encounter (Signed)
Follow up call placed, VM obtained and message left. 

## 2023-07-28 ENCOUNTER — Ambulatory Visit: Payer: BC Managed Care – PPO | Admitting: Internal Medicine

## 2023-07-28 ENCOUNTER — Encounter: Payer: Self-pay | Admitting: Internal Medicine

## 2023-07-28 VITALS — BP 130/84 | HR 68 | Temp 98.8°F | Ht 65.0 in | Wt 181.8 lb

## 2023-07-28 DIAGNOSIS — E538 Deficiency of other specified B group vitamins: Secondary | ICD-10-CM

## 2023-07-28 DIAGNOSIS — E559 Vitamin D deficiency, unspecified: Secondary | ICD-10-CM

## 2023-07-28 DIAGNOSIS — Z0000 Encounter for general adult medical examination without abnormal findings: Secondary | ICD-10-CM

## 2023-07-28 DIAGNOSIS — E038 Other specified hypothyroidism: Secondary | ICD-10-CM | POA: Diagnosis not present

## 2023-07-28 DIAGNOSIS — Z13220 Encounter for screening for lipoid disorders: Secondary | ICD-10-CM | POA: Diagnosis not present

## 2023-07-28 DIAGNOSIS — Z23 Encounter for immunization: Secondary | ICD-10-CM

## 2023-07-28 DIAGNOSIS — R7303 Prediabetes: Secondary | ICD-10-CM | POA: Diagnosis not present

## 2023-07-28 LAB — COMPREHENSIVE METABOLIC PANEL
ALT: 22 U/L (ref 0–35)
AST: 27 U/L (ref 0–37)
Albumin: 4.7 g/dL (ref 3.5–5.2)
Alkaline Phosphatase: 56 U/L (ref 39–117)
BUN: 20 mg/dL (ref 6–23)
CO2: 30 meq/L (ref 19–32)
Calcium: 10 mg/dL (ref 8.4–10.5)
Chloride: 103 meq/L (ref 96–112)
Creatinine, Ser: 0.87 mg/dL (ref 0.40–1.20)
GFR: 76.87 mL/min (ref >60.00)
Glucose, Bld: 107 mg/dL — ABNORMAL HIGH (ref 70–99)
Potassium: 4.1 meq/L (ref 3.5–5.1)
Sodium: 139 meq/L (ref 135–145)
Total Bilirubin: 0.6 mg/dL (ref 0.2–1.2)
Total Protein: 7.5 g/dL (ref 6.0–8.3)

## 2023-07-28 LAB — VITAMIN B12: Vitamin B-12: 339 pg/mL (ref 211–911)

## 2023-07-28 LAB — URINALYSIS
Hgb urine dipstick: NEGATIVE
Ketones, ur: NEGATIVE
Leukocytes,Ua: NEGATIVE
Nitrite: NEGATIVE
Specific Gravity, Urine: 1.02 (ref 1.000–1.030)
Total Protein, Urine: NEGATIVE
Urine Glucose: NEGATIVE
Urobilinogen, UA: 0.2 (ref 0.0–1.0)
pH: 6.5 (ref 5.0–8.0)

## 2023-07-28 LAB — TSH: TSH: 5.26 u[IU]/mL (ref 0.35–5.50)

## 2023-07-28 LAB — CBC WITH DIFFERENTIAL/PLATELET
Basophils Absolute: 0 10*3/uL (ref 0.0–0.1)
Basophils Relative: 0.9 % (ref 0.0–3.0)
Eosinophils Absolute: 0.2 10*3/uL (ref 0.0–0.7)
Eosinophils Relative: 3.3 % (ref 0.0–5.0)
HCT: 40.9 % (ref 36.0–46.0)
Hemoglobin: 13.8 g/dL (ref 12.0–15.0)
Lymphocytes Relative: 34.5 % (ref 12.0–46.0)
Lymphs Abs: 1.6 10*3/uL (ref 0.7–4.0)
MCHC: 33.7 g/dL (ref 30.0–36.0)
MCV: 94.7 fL (ref 78.0–100.0)
Monocytes Absolute: 0.5 10*3/uL (ref 0.1–1.0)
Monocytes Relative: 11 % (ref 3.0–12.0)
Neutro Abs: 2.3 10*3/uL (ref 1.4–7.7)
Neutrophils Relative %: 50.3 % (ref 43.0–77.0)
Platelets: 190 10*3/uL (ref 150.0–400.0)
RBC: 4.32 Mil/uL (ref 3.87–5.11)
RDW: 13.1 % (ref 11.5–15.5)
WBC: 4.6 10*3/uL (ref 4.0–10.5)

## 2023-07-28 LAB — LIPID PANEL
Cholesterol: 190 mg/dL (ref 0–200)
HDL: 59.4 mg/dL (ref >39.00)
LDL Cholesterol: 114 mg/dL — ABNORMAL HIGH (ref 0–99)
NonHDL: 130.83
Total CHOL/HDL Ratio: 3
Triglycerides: 83 mg/dL (ref 0.0–149.0)
VLDL: 16.6 mg/dL (ref 0.0–40.0)

## 2023-07-28 LAB — VITAMIN D 25 HYDROXY (VIT D DEFICIENCY, FRACTURES): VITD: 56.15 ng/mL (ref 30.00–100.00)

## 2023-07-28 LAB — HEMOGLOBIN A1C: Hgb A1c MFr Bld: 5.6 % (ref 4.6–6.5)

## 2023-07-28 NOTE — Assessment & Plan Note (Signed)
Check B12 

## 2023-07-28 NOTE — Assessment & Plan Note (Signed)
Check A1c. 

## 2023-07-28 NOTE — Assessment & Plan Note (Signed)
Follow-up with Dr. Cleon Gustin.  Check FT4, TSH today

## 2023-07-28 NOTE — Progress Notes (Signed)
Subjective:  Patient ID: Morgan Mejia, female    DOB: September 02, 1971  Age: 52 y.o. MRN: [id number]  CC: Annual Exam (Annual Exam)   HPI [name] presents for a well exam C/o wt gain  Outpatient Medications Prior to Visit  Medication Sig Dispense Refill   B Complex-Folic Acid (B COMPLEX PLUS) TABS Take 1 tablet by mouth daily. 100 tablet 3   chlorthalidone (HYGROTON) 25 MG tablet TAKE 1 TABLET (25 MG TOTAL) BY MOUTH DAILY. 90 tablet 3   Cholecalciferol (VITAMIN D3) 50 MCG (2000 UT) capsule Take 2 capsules (4,000 Units total) by mouth daily. 100 capsule 3   levothyroxine (SYNTHROID) 137 MCG tablet Take 1 tablet (137 mcg total) by mouth daily before breakfast. 30 tablet 0   TRINTELLIX 20 MG TABS tablet TAKE 1 TABLET BY MOUTH EVERY DAY 90 tablet 1   No facility-administered medications prior to visit.    ROS: Review of Systems  Constitutional:  Positive for unexpected weight change. Negative for activity change, appetite change, chills and fatigue.  HENT:  Negative for congestion, mouth sores and sinus pressure.   Eyes:  Negative for visual disturbance.  Respiratory:  Negative for cough and chest tightness.   Gastrointestinal:  Negative for abdominal pain and nausea.  Genitourinary:  Negative for difficulty urinating, frequency and vaginal pain.  Musculoskeletal:  Negative for back pain and gait problem.  Skin:  Negative for pallor and rash.  Neurological:  Negative for dizziness, tremors, weakness, numbness and headaches.  Psychiatric/Behavioral:  Negative for confusion and sleep disturbance.     Objective:  BP 130/84   Pulse 68   Temp 98.8 F (37.1 C) (Oral)   Ht 5\' 5"  (1.651 m)   Wt 181 lb 12.8 oz (82.5 kg)   SpO2 97%   BMI 30.25 kg/m   BP Readings from Last 3 Encounters:  07/28/23 130/84  06/29/23 131/76  04/29/22 118/80    Wt Readings from Last 3 Encounters:  07/28/23 181 lb 12.8 oz (82.5 kg)  06/29/23 180 lb (81.6 kg)  05/26/23 180 lb (81.6 kg)     Physical Exam Constitutional:      General: She is not in acute distress.    Appearance: She is well-developed.  HENT:     Head: Normocephalic.     Right Ear: External ear normal.     Left Ear: External ear normal.     Nose: Nose normal.  Eyes:     General:        Right eye: No discharge.        Left eye: No discharge.     Conjunctiva/sclera: Conjunctivae normal.     Pupils: Pupils are equal, round, and reactive to light.  Neck:     Thyroid: No thyromegaly.     Vascular: No JVD.     Trachea: No tracheal deviation.  Cardiovascular:     Rate and Rhythm: Normal rate and regular rhythm.     Heart sounds: Normal heart sounds.  Pulmonary:     Effort: No respiratory distress.     Breath sounds: No stridor. No wheezing.  Abdominal:     General: Bowel sounds are normal. There is no distension.     Palpations: Abdomen is soft. There is no mass.     Tenderness: There is no abdominal tenderness. There is no guarding or rebound.  Musculoskeletal:        General: No tenderness.     Cervical back: Normal range of motion and neck supple. No  rigidity.  Lymphadenopathy:     Cervical: No cervical adenopathy.  Skin:    Findings: No erythema or rash.  Neurological:     Cranial Nerves: No cranial nerve deficit.     Motor: No abnormal muscle tone.     Coordination: Coordination normal.     Deep Tendon Reflexes: Reflexes normal.  Psychiatric:        Behavior: Behavior normal.        Thought Content: Thought content normal.        Judgment: Judgment normal.     Lab Results  Component Value Date   WBC 4.6 07/28/2023   HGB 13.8 07/28/2023   HCT 40.9 07/28/2023   PLT 190.0 07/28/2023   GLUCOSE 107 (H) 07/28/2023   CHOL 190 07/28/2023   TRIG 83.0 07/28/2023   HDL 59.40 07/28/2023   LDLCALC 114 (H) 07/28/2023   ALT 22 07/28/2023   AST 27 07/28/2023   NA 139 07/28/2023   K 4.1 07/28/2023   CL 103 07/28/2023   CREATININE 0.87 07/28/2023   BUN 20 07/28/2023   CO2 30 07/28/2023    TSH 5.26 07/28/2023   HGBA1C 5.6 07/28/2023    MM Digital Diagnostic Unilat R  Result Date: 02/02/2013 *RADIOLOGY REPORT* Clinical Data:  52 year old female with abnormal screening mammogram - possible right breast mass. DIGITAL DIAGNOSTIC RIGHT MAMMOGRAM  AND RIGHT BREAST ULTRASOUND: Comparison:  01/17/2013 and prior mammograms dating back to 05/23/2008 Findings: ACR Breast Density Category 3: The breast tissue is heterogeneously dense. Spot compression views of the right breast demonstrate minimal increased density in the lower outer right breast, in the area of the screening study finding.  There is no evidence of persistent mass, distortion or worrisome calcifications. On physical exam, no palpable abnormalities identified within the lower outer right breast. Ultrasound is performed, showing no evidence of solid or cystic mass, distortion or abnormal areas of shadowing within the lower outer right breast.  An island of normal fibroglandular tissue is present. IMPRESSION: No suspicious mammographic, palpable or sonographic abnormalities within the lower outer right breast, in the area of the screening study finding. BI-RADS CATEGORY 1:  Negative. RECOMMENDATION: Bilateral screening mammograms in 1 year. I have discussed the findings and recommendations with the patient. Results were also provided in writing at the conclusion of the visit.  If applicable, a reminder letter will be sent to the patient regarding the next appointment. Original Report Authenticated By: Harmon Pier, M.D.   US Breast Right  Result Date: 02/02/2013 *RADIOLOGY REPORT* Clinical Data:  52 year old female with abnormal screening mammogram - possible right breast mass. DIGITAL DIAGNOSTIC RIGHT MAMMOGRAM  AND RIGHT BREAST ULTRASOUND: Comparison:  01/17/2013 and prior mammograms dating back to 05/23/2008 Findings: ACR Breast Density Category 3: The breast tissue is heterogeneously dense. Spot compression views of the right breast  demonstrate minimal increased density in the lower outer right breast, in the area of the screening study finding.  There is no evidence of persistent mass, distortion or worrisome calcifications. On physical exam, no palpable abnormalities identified within the lower outer right breast. Ultrasound is performed, showing no evidence of solid or cystic mass, distortion or abnormal areas of shadowing within the lower outer right breast.  An island of normal fibroglandular tissue is present. IMPRESSION: No suspicious mammographic, palpable or sonographic abnormalities within the lower outer right breast, in the area of the screening study finding. BI-RADS CATEGORY 1:  Negative. RECOMMENDATION: Bilateral screening mammograms in 1 year. I have discussed the findings and recommendations  with the patient. Results were also provided in writing at the conclusion of the visit.  If applicable, a reminder letter will be sent to the patient regarding the next appointment. Original Report Authenticated By: Harmon Pier, M.D.    Assessment & Plan:   Problem List Items Addressed This Visit     Well adult exam - Primary    We discussed age appropriate health related issues, including available/recomended screening tests and vaccinations. Labs were ordered to be later reviewed . All questions were answered. We discussed one or more of the following - seat belt use, use of sunscreen/sun exposure exercise, fall risk reduction, second hand smoke exposure, firearm use and storage, seat belt use, a need for adhering to healthy diet and exercise. Labs were ordered.  All questions were answered. Colon 2024 Dr C - nl, due in 2034      Relevant Orders   TSH (Completed)   Urinalysis (Completed)   CBC with Differential/Platelet (Completed)   Lipid panel (Completed)   Comprehensive metabolic panel (Completed)   Hemoglobin A1c (Completed)   VITAMIN D 25 Hydroxy (Vit-D Deficiency, Fractures) (Completed)   Vitamin B12 (Completed)    Hypothyroidism    Follow-up with Dr. Cleon Gustin.  Check FT4, TSH today      B12 deficiency    Check B12      Relevant Orders   Vitamin B12 (Completed)   Vitamin D deficiency    On vit D Check vitamin D level today      Relevant Orders   VITAMIN D 25 Hydroxy (Vit-D Deficiency, Fractures) (Completed)   Prediabetes    Check A1c      Relevant Orders   Hemoglobin A1c (Completed)   CT CARDIAC SCORING ([facility] LOCATIONS ONLY)   Other Visit Diagnoses     Flu vaccine need       Relevant Orders   Flu vaccine trivalent PF, 6mos and older(Flulaval,Afluria,Fluarix,Fluzone) (Completed)         No orders of the defined types were placed in this encounter.     Follow-up: Return in about 1 year (around 07/27/2024) for Wellness Exam.  Sonda Primes, MD

## 2023-07-28 NOTE — Assessment & Plan Note (Addendum)
We discussed age appropriate health related issues, including available/recomended screening tests and vaccinations. Labs were ordered to be later reviewed . All questions were answered. We discussed one or more of the following - seat belt use, use of sunscreen/sun exposure exercise, fall risk reduction, second hand smoke exposure, firearm use and storage, seat belt use, a need for adhering to healthy diet and exercise. Labs were ordered.  All questions were answered. Colon 2024 Dr C - nl, due in 2034

## 2023-07-28 NOTE — Assessment & Plan Note (Signed)
On vit D Check vitamin D level today

## 2023-08-01 ENCOUNTER — Encounter: Payer: Self-pay | Admitting: Internal Medicine

## 2023-08-08 ENCOUNTER — Other Ambulatory Visit: Payer: Self-pay | Admitting: Internal Medicine

## 2023-08-08 MED ORDER — VORTIOXETINE HBR 20 MG PO TABS: 20.0000 mg | ORAL_TABLET | Freq: Every day | ORAL | 1 refills | Status: DC

## 2023-08-09 ENCOUNTER — Other Ambulatory Visit (HOSPITAL_COMMUNITY): Payer: Self-pay

## 2023-08-09 ENCOUNTER — Telehealth: Payer: Self-pay

## 2023-08-09 NOTE — Telephone Encounter (Signed)
Pharmacy Patient Advocate Encounter  Received notification from [facility] that Prior Authorization for TRINTELLIX has been APPROVED from 08/09/23 to 08/08/24   PA #/Case ID/Reference #: [id number]

## 2023-08-09 NOTE — Telephone Encounter (Signed)
Pharmacy Patient Advocate Encounter    Received notification from RX Request Messages that prior authorization for Trintellix is required/requested.    Insurance verification completed.   The patient is insured through [facility] .    Per test claim: PA required; PA submitted to above mentioned insurance via CoverMyMeds Key/confirmation #/EOC Key: [id number]    Status is pending

## 2023-08-10 ENCOUNTER — Other Ambulatory Visit (HOSPITAL_COMMUNITY): Payer: Self-pay

## 2023-08-10 ENCOUNTER — Other Ambulatory Visit: Payer: Self-pay | Admitting: Internal Medicine

## 2023-08-10 MED ORDER — TIRZEPATIDE-WEIGHT MANAGEMENT 2.5 MG/0.5ML ~~LOC~~ SOLN: 2.5000 mg | SUBCUTANEOUS | 2 refills | Status: AC

## 2023-08-10 NOTE — Telephone Encounter (Signed)
Placed a call to [facility] to notify of the approval. Per the pharmacist the copay is $380 for a 30 day supply including a coupon.

## 2023-08-25 NOTE — Telephone Encounter (Signed)
PA has been Approved

## 2023-10-18 DIAGNOSIS — E039 Hypothyroidism, unspecified: Secondary | ICD-10-CM | POA: Diagnosis not present

## 2023-10-25 DIAGNOSIS — E039 Hypothyroidism, unspecified: Secondary | ICD-10-CM | POA: Diagnosis not present

## 2023-12-06 ENCOUNTER — Ambulatory Visit (INDEPENDENT_AMBULATORY_CARE_PROVIDER_SITE_OTHER): Admitting: Family Medicine

## 2023-12-06 VITALS — BP 142/86 | HR 73 | Temp 98.0°F | Ht 65.0 in | Wt 187.0 lb

## 2023-12-06 DIAGNOSIS — L03119 Cellulitis of unspecified part of limb: Secondary | ICD-10-CM

## 2023-12-06 DIAGNOSIS — B353 Tinea pedis: Secondary | ICD-10-CM

## 2023-12-06 MED ORDER — TERBINAFINE HCL 250 MG PO TABS: 250.0000 mg | ORAL_TABLET | Freq: Every day | ORAL | 0 refills | Status: AC

## 2023-12-06 MED ORDER — SULFAMETHOXAZOLE-TRIMETHOPRIM 800-160 MG PO TABS: 1.0000 | ORAL_TABLET | Freq: Two times a day (BID) | ORAL | 0 refills | Status: AC

## 2023-12-06 NOTE — Progress Notes (Signed)
 Subjective:     Patient ID: Morgan Mejia, female    DOB: 1971/02/11, 53 y.o.   MRN: [id number]  Chief Complaint  Patient presents with   Rash    Right foot, started around October as red dots. Gradually got itchy and flaky. Gotten worse, has picked at it some and now it's blistered. Blisters showed up within the last couple days. Painful    HPI   History of Present Illness          C/o rash of right foot since October  Initially the rash was smaller and had itchy, red pumps  Later it became scaly and more painful.  Lately, she has noticed pustules   She has been using Neosporin and hydrogen peroxide   No arthralgias, myalgias or other rashes    Health Maintenance Due  Topic Date Due   HIV Screening  Never done   Hepatitis C Screening  Never done   Zoster Vaccines- Shingrix (1 of 2) Never done   Cervical Cancer Screening (HPV/Pap Cotest)  Never done   MAMMOGRAM  08/25/2021    Past Medical History:  Diagnosis Date   Anxiety    B12 deficiency    Back pain    Chicken pox    Depression    on meds   GERD (gastroesophageal reflux disease)    hx of   Hypothyroidism    on meds   PONV (postoperative nausea and vomiting)    Swelling    Vitamin D deficiency     Past Surgical History:  Procedure Laterality Date   Bone Spur Right 1999   WISDOM TOOTH EXTRACTION  1990    Family History  Problem Relation Age of Onset   Colon polyps Mother 1   Diabetes Mother    Hyperlipidemia Mother    Uterine cancer Mother 18   Pernicious anemia Mother    Hypertension Mother    Thyroid disease Mother    Depression Mother    Anxiety disorder Mother    Obesity Mother    Alcoholism Father    Leukemia Father 57   Pernicious anemia Maternal Aunt    Colon polyps Maternal Uncle 38   Colon polyps Maternal Grandfather 66   Throat cancer Maternal Grandfather 20       smoker   Colon cancer Neg Hx    Esophageal cancer Neg Hx    Rectal cancer Neg Hx    Stomach cancer Neg Hx      Social History   Socioeconomic History   Marital status: Married    Spouse name: Shalay Carder   Number of children: 2   Years of education: Not on file   Highest education level: Master's degree (e.g., MA, MS, MEng, MEd, MSW, MBA)  Occupational History   Occupation: Tax Airline pilot  Tobacco Use   Smoking status: Former   Smokeless tobacco: Never  Advertising account planner   Vaping status: Never Used  Substance and Sexual Activity   Alcohol use: Yes    Alcohol/week: 5.0 standard drinks of alcohol    Types: 5 Standard drinks or equivalent per week   Drug use: Never   Sexual activity: Not on file  Other Topics Concern   Not on file  Social History Narrative   Not on file   Social Drivers of Health   Financial Resource Strain: Low Risk  (12/06/2023)   Overall Financial Resource Strain (CARDIA)    Difficulty of Paying Living Expenses: Not very hard  Food Insecurity: No Food  Insecurity (12/06/2023)   Hunger Vital Sign    Worried About Running Out of Food in the Last Year: Never true    Ran Out of Food in the Last Year: Never true  Transportation Needs: No Transportation Needs (12/06/2023)   PRAPARE - Administrator, Civil Service (Medical): No    Lack of Transportation (Non-Medical): No  Physical Activity: Insufficiently Active (12/06/2023)   Exercise Vital Sign    Days of Exercise per Week: 2 days    Minutes of Exercise per Session: 20 min  Stress: Stress Concern Present (12/06/2023)   [employer] of Occupational Health - Occupational Stress Questionnaire    Feeling of Stress : Rather much  Social Connections: Moderately Integrated (12/06/2023)   Social Connection and Isolation Panel [NHANES]    Frequency of Communication with Friends and Family: More than three times a week    Frequency of Social Gatherings with Friends and Family: Once a week    Attends Religious Services: Never    Database administrator or Organizations: Yes    Attends Engineer, structural:  More than 4 times per year    Marital Status: Married  Catering manager Violence: Not on file    Outpatient Medications Prior to Visit  Medication Sig Dispense Refill   B Complex-Folic Acid (B COMPLEX PLUS) TABS Take 1 tablet by mouth daily. 100 tablet 3   chlorthalidone (HYGROTON) 25 MG tablet TAKE 1 TABLET (25 MG TOTAL) BY MOUTH DAILY. 90 tablet 3   Cholecalciferol (VITAMIN D3) 50 MCG (2000 UT) capsule Take 2 capsules (4,000 Units total) by mouth daily. 100 capsule 3   levothyroxine (SYNTHROID) 137 MCG tablet Take 1 tablet (137 mcg total) by mouth daily before breakfast. 30 tablet 0   tirzepatide (ZEPBOUND) 2.5 MG/0.5ML injection vial Inject 2.5 mg into the skin once a week. (Patient not taking: Reported on 12/06/2023) 2 mL 2   TRINTELLIX 20 MG TABS tablet TAKE 1 TABLET BY MOUTH EVERY DAY (Patient not taking: Reported on 12/06/2023) 90 tablet 1   No facility-administered medications prior to visit.    Allergies  Allergen Reactions   Aspirin Swelling    FACIAL SWELLING   Wellbutrin [Bupropion] Hives, Itching, Swelling, Dermatitis and Rash    Review of Systems  Constitutional:  Negative for chills and fever.  Respiratory:  Negative for shortness of breath.   Cardiovascular:  Negative for chest pain, palpitations and leg swelling.  Gastrointestinal:  Negative for abdominal pain, diarrhea, nausea and vomiting.  Skin:  Positive for itching and rash.  Neurological:  Negative for dizziness and focal weakness.       Objective:    Physical Exam Constitutional:      General: She is not in acute distress.    Appearance: She is not ill-appearing.  Eyes:     Extraocular Movements: Extraocular movements intact.     Conjunctiva/sclera: Conjunctivae normal.  Cardiovascular:     Rate and Rhythm: Normal rate.  Pulmonary:     Effort: Pulmonary effort is normal.  Musculoskeletal:     Cervical back: Normal range of motion and neck supple.  Skin:    General: Skin is warm and dry.      Findings: Rash present.     Comments: Dry patch of scaling, pustules and erythematous base.   Neurological:     General: No focal deficit present.     Mental Status: She is alert and oriented to person, place, and time.  Psychiatric:  Mood and Affect: Mood normal.        Behavior: Behavior normal.        Thought Content: Thought content normal.      BP (!) 142/86 (BP Location: Left Arm, Patient Position: Sitting)   Pulse 73   Temp 98 F (36.7 C) (Temporal)   Ht 5\' 5"  (1.651 m)   Wt 187 lb (84.8 kg)   SpO2 100%   BMI 31.12 kg/m  Wt Readings from Last 3 Encounters:  12/06/23 187 lb (84.8 kg)  07/28/23 181 lb 12.8 oz (82.5 kg)  06/29/23 180 lb (81.6 kg)       Assessment & Plan:   Problem List Items Addressed This Visit   None Visit Diagnoses       Tinea pedis of right foot    -  Primary   Relevant Medications   terbinafine (LAMISIL) 250 MG tablet   sulfamethoxazole-trimethoprim (BACTRIM DS) 800-160 MG tablet     Cellulitis of foot          Appears fungal with secondary bacterial infection.  Treat with Bactrim and oral terbinafine.  Follow up with PCP in 3-4 weeks.   I am having Donnie Coffin start on terbinafine and sulfamethoxazole-trimethoprim. I am also having her maintain her levothyroxine, B Complex Plus, Vitamin D3, chlorthalidone, Trintellix, and tirzepatide.  Meds ordered this encounter  Medications   terbinafine (LAMISIL) 250 MG tablet    Sig: Take 1 tablet (250 mg total) by mouth daily.    Dispense:  14 tablet    Refill:  0    Supervising Provider:   Hillard Danker A [[id number]]   sulfamethoxazole-trimethoprim (BACTRIM DS) 800-160 MG tablet    Sig: Take 1 tablet by mouth 2 (two) times daily.    Dispense:  14 tablet    Refill:  0    Supervising Provider:   Hillard Danker A [[id number]]

## 2023-12-06 NOTE — Patient Instructions (Signed)
 Take the oral antifungal and oral antibiotic as prescribed.

## 2023-12-22 ENCOUNTER — Ambulatory Visit: Admitting: Family Medicine

## 2023-12-28 ENCOUNTER — Ambulatory Visit (INDEPENDENT_AMBULATORY_CARE_PROVIDER_SITE_OTHER): Payer: Self-pay | Admitting: Podiatry

## 2023-12-28 ENCOUNTER — Ambulatory Visit: Admitting: Family Medicine

## 2023-12-28 DIAGNOSIS — B999 Unspecified infectious disease: Secondary | ICD-10-CM | POA: Diagnosis not present

## 2023-12-28 DIAGNOSIS — D485 Neoplasm of uncertain behavior of skin: Secondary | ICD-10-CM | POA: Insufficient documentation

## 2023-12-28 MED ORDER — TERBINAFINE HCL 250 MG PO TABS: 250.0000 mg | ORAL_TABLET | Freq: Every day | ORAL | 0 refills | Status: AC

## 2023-12-28 MED ORDER — CLOTRIMAZOLE-BETAMETHASONE 1-0.05 % EX CREA: 1.0000 | TOPICAL_CREAM | Freq: Every day | CUTANEOUS | 0 refills | Status: AC

## 2023-12-28 MED ORDER — DOXYCYCLINE HYCLATE 100 MG PO TABS: 100.0000 mg | ORAL_TABLET | Freq: Two times a day (BID) | ORAL | 0 refills | Status: AC

## 2023-12-28 NOTE — Progress Notes (Signed)
 Subjective:  Patient ID: Morgan Mejia, female    DOB: 1971-08-11,  MRN: [id number]  Chief Complaint  Patient presents with   Tinea Pedis    53 y.o. female presents with the above complaint.  Patient presents with bilateral foot superinfection with underlying athlete's foot.  She states been present for quite some time.  She is try some topical medication and which has helped.  She would like to discuss aggressive treatment options pain scale is 5 out of 10 dull aching in nature more itching and some skin lysis noted.  No open wounds or lesion noted   Review of Systems: Negative except as noted in the HPI. Denies N/V/F/Ch.  Past Medical History:  Diagnosis Date   Anxiety    B12 deficiency    Back pain    Chicken pox    Depression    on meds   GERD (gastroesophageal reflux disease)    hx of   Hypothyroidism    on meds   PONV (postoperative nausea and vomiting)    Swelling    Vitamin D  deficiency     Current Outpatient Medications:    clotrimazole -betamethasone  (LOTRISONE ) cream, Apply 1 Application topically daily., Disp: 30 g, Rfl: 0   doxycycline  (VIBRA -TABS) 100 MG tablet, Take 1 tablet (100 mg total) by mouth 2 (two) times daily., Disp: 28 tablet, Rfl: 0   terbinafine  (LAMISIL ) 250 MG tablet, Take 1 tablet (250 mg total) by mouth daily., Disp: 30 tablet, Rfl: 0   B Complex-Folic Acid (B COMPLEX PLUS) TABS, Take 1 tablet by mouth daily., Disp: 100 tablet, Rfl: 3   chlorthalidone  (HYGROTON ) 25 MG tablet, TAKE 1 TABLET (25 MG TOTAL) BY MOUTH DAILY., Disp: 90 tablet, Rfl: 3   Cholecalciferol (VITAMIN D3) 50 MCG (2000 UT) capsule, Take 2 capsules (4,000 Units total) by mouth daily., Disp: 100 capsule, Rfl: 3   levothyroxine  (SYNTHROID ) 137 MCG tablet, Take 1 tablet (137 mcg total) by mouth daily before breakfast., Disp: 30 tablet, Rfl: 0   sulfamethoxazole -trimethoprim  (BACTRIM  DS) 800-160 MG tablet, Take 1 tablet by mouth 2 (two) times daily., Disp: 14 tablet, Rfl: 0    terbinafine  (LAMISIL ) 250 MG tablet, Take 1 tablet (250 mg total) by mouth daily., Disp: 14 tablet, Rfl: 0   tirzepatide  (ZEPBOUND ) 2.5 MG/0.5ML injection vial, Inject 2.5 mg into the skin once a week. (Patient not taking: Reported on 12/06/2023), Disp: 2 mL, Rfl: 2   TRINTELLIX  20 MG TABS tablet, TAKE 1 TABLET BY MOUTH EVERY DAY (Patient not taking: Reported on 12/06/2023), Disp: 90 tablet, Rfl: 1  Social History   Tobacco Use  Smoking Status Former  Smokeless Tobacco Never    Allergies  Allergen Reactions   Aspirin Swelling    FACIAL SWELLING   Wellbutrin [Bupropion] Hives, Itching, Swelling, Dermatitis and Rash   Objective:  There were no vitals filed for this visit. There is no height or weight on file to calculate BMI. Constitutional Well developed. Well nourished.  Vascular Dorsalis pedis pulses palpable bilaterally. Posterior tibial pulses palpable bilaterally. Capillary refill normal to all digits.  No cyanosis or clubbing noted. Pedal hair growth normal.  Neurologic Normal speech. Oriented to person, place, and time. Epicritic sensation to light touch grossly present bilaterally.  Dermatologic Bilateral superinfection noted with underlying skin epidermolysis with macerated skin tissue.  No deeper infection noted no open wounds or lesion noted.  Subjective component of itching noted  Orthopedic: Normal joint ROM without pain or crepitus bilaterally. No visible deformities. No bony tenderness.  Radiographs: None Assessment:  No diagnosis found. Plan:  Patient was evaluated and treated and all questions answered.  Bilateral superinfection - All questions and concerns were discussed with the patient in extensive detail given the amount of superinfection is present patient would benefit from doxycycline  for 14 days Lamisil  for 30 days as well as Lotrisone  cream.  The medication was sent to the pharmacy - I will see her back in 6 weeks if he has no resolve meant we  discussed more aggressive treatment options.  I discussed with the patient she states understanding.

## 2024-01-09 ENCOUNTER — Ambulatory Visit: Admitting: Internal Medicine

## 2024-01-10 DIAGNOSIS — Z124 Encounter for screening for malignant neoplasm of cervix: Secondary | ICD-10-CM | POA: Diagnosis not present

## 2024-01-10 DIAGNOSIS — Z01419 Encounter for gynecological examination (general) (routine) without abnormal findings: Secondary | ICD-10-CM | POA: Diagnosis not present

## 2024-01-10 DIAGNOSIS — Z6830 Body mass index (BMI) 30.0-30.9, adult: Secondary | ICD-10-CM | POA: Diagnosis not present

## 2024-01-10 DIAGNOSIS — Z1231 Encounter for screening mammogram for malignant neoplasm of breast: Secondary | ICD-10-CM | POA: Diagnosis not present

## 2024-01-10 DIAGNOSIS — N951 Menopausal and female climacteric states: Secondary | ICD-10-CM | POA: Diagnosis not present

## 2024-01-24 ENCOUNTER — Other Ambulatory Visit: Payer: Self-pay | Admitting: Podiatry

## 2024-02-01 ENCOUNTER — Ambulatory Visit: Admitting: Podiatry

## 2024-02-15 ENCOUNTER — Ambulatory Visit (INDEPENDENT_AMBULATORY_CARE_PROVIDER_SITE_OTHER): Admitting: Podiatry

## 2024-02-15 DIAGNOSIS — B999 Unspecified infectious disease: Secondary | ICD-10-CM

## 2024-02-15 NOTE — Progress Notes (Signed)
 Subjective:  Patient ID: Morgan Mejia, female    DOB: October 04, 1970,  MRN: [id number]  Chief Complaint  Patient presents with   Tinea Pedis    53 y.o. female presents with the above complaint.  Patient presents for follow-up of bilateral superinfection she states she was doing little bit better Lamisil  helped considerably.  However started coming back as soon as the medication wore off.   Review of Systems: Negative except as noted in the HPI. Denies N/V/F/Ch.  Past Medical History:  Diagnosis Date   Anxiety    B12 deficiency    Back pain    Chicken pox    Depression    on meds   GERD (gastroesophageal reflux disease)    hx of   Hypothyroidism    on meds   PONV (postoperative nausea and vomiting)    Swelling    Vitamin D  deficiency     Current Outpatient Medications:    terbinafine  (LAMISIL ) 250 MG tablet, Take 1 tablet (250 mg total) by mouth daily., Disp: 90 tablet, Rfl: 0   B Complex-Folic Acid (B COMPLEX PLUS) TABS, Take 1 tablet by mouth daily., Disp: 100 tablet, Rfl: 3   chlorthalidone  (HYGROTON ) 25 MG tablet, TAKE 1 TABLET (25 MG TOTAL) BY MOUTH DAILY., Disp: 90 tablet, Rfl: 3   Cholecalciferol (VITAMIN D3) 50 MCG (2000 UT) capsule, Take 2 capsules (4,000 Units total) by mouth daily., Disp: 100 capsule, Rfl: 3   clotrimazole -betamethasone  (LOTRISONE ) cream, Apply 1 Application topically daily., Disp: 30 g, Rfl: 0   doxycycline  (VIBRA -TABS) 100 MG tablet, Take 1 tablet (100 mg total) by mouth 2 (two) times daily., Disp: 28 tablet, Rfl: 0   levothyroxine  (SYNTHROID ) 137 MCG tablet, Take 1 tablet (137 mcg total) by mouth daily before breakfast., Disp: 30 tablet, Rfl: 0   sulfamethoxazole -trimethoprim  (BACTRIM  DS) 800-160 MG tablet, Take 1 tablet by mouth 2 (two) times daily., Disp: 14 tablet, Rfl: 0   terbinafine  (LAMISIL ) 250 MG tablet, Take 1 tablet (250 mg total) by mouth daily., Disp: 14 tablet, Rfl: 0   terbinafine  (LAMISIL ) 250 MG tablet, Take 1 tablet (250 mg  total) by mouth daily., Disp: 30 tablet, Rfl: 0   tirzepatide  (ZEPBOUND ) 2.5 MG/0.5ML injection vial, Inject 2.5 mg into the skin once a week. (Patient not taking: Reported on 12/06/2023), Disp: 2 mL, Rfl: 2   TRINTELLIX  20 MG TABS tablet, TAKE 1 TABLET BY MOUTH EVERY DAY (Patient not taking: Reported on 12/06/2023), Disp: 90 tablet, Rfl: 1  Social History   Tobacco Use  Smoking Status Former  Smokeless Tobacco Never    Allergies  Allergen Reactions   Aspirin Swelling    FACIAL SWELLING   Wellbutrin [Bupropion] Hives, Itching, Swelling, Dermatitis and Rash   Objective:  There were no vitals filed for this visit. There is no height or weight on file to calculate BMI. Constitutional Well developed. Well nourished.  Vascular Dorsalis pedis pulses palpable bilaterally. Posterior tibial pulses palpable bilaterally. Capillary refill normal to all digits.  No cyanosis or clubbing noted. Pedal hair growth normal.  Neurologic Normal speech. Oriented to person, place, and time. Epicritic sensation to light touch grossly present bilaterally.  Dermatologic Skin epidermolysis noted.  Still some left.  No open wounds or lesion noted.  Mild component of itching noted.  Orthopedic: Normal joint ROM without pain or crepitus bilaterally. No visible deformities. No bony tenderness.   Radiographs: None Assessment:   1. Superinfection    Plan:  Patient was evaluated and treated and all  questions answered.  Bilateral superinfection improving some - All questions and concerns were discussed with the patient in extensive detail clinically Lamisil  therapy helped decrease the fungus at this time patient will benefit 90-day course of Lamisil  therapy Lamisil  was sent to the pharmacy.  She does not have any liver issues.   - Patient will benefit from Betadine wet-to-dry dressing

## 2024-02-17 DIAGNOSIS — B999 Unspecified infectious disease: Secondary | ICD-10-CM | POA: Diagnosis not present

## 2024-02-18 LAB — HEPATIC FUNCTION PANEL
ALT: 17 IU/L (ref 0–32)
AST: 24 IU/L (ref 0–40)
Albumin: 4.5 g/dL (ref 3.8–4.9)
Alkaline Phosphatase: 66 IU/L (ref 44–121)
Bilirubin Total: 0.3 mg/dL (ref 0.0–1.2)
Bilirubin, Direct: 0.12 mg/dL (ref 0.00–0.40)
Total Protein: 6.9 g/dL (ref 6.0–8.5)

## 2024-02-20 MED ORDER — TERBINAFINE HCL 250 MG PO TABS: 250.0000 mg | ORAL_TABLET | Freq: Every day | ORAL | 0 refills | Status: AC

## 2024-02-24 DIAGNOSIS — N951 Menopausal and female climacteric states: Secondary | ICD-10-CM | POA: Diagnosis not present

## 2024-03-13 ENCOUNTER — Ambulatory Visit (INDEPENDENT_AMBULATORY_CARE_PROVIDER_SITE_OTHER): Admitting: Podiatry

## 2024-03-13 ENCOUNTER — Encounter: Payer: Self-pay | Admitting: Podiatry

## 2024-03-13 DIAGNOSIS — L209 Atopic dermatitis, unspecified: Secondary | ICD-10-CM | POA: Diagnosis not present

## 2024-03-13 MED ORDER — CLOBETASOL PROPIONATE 0.05 % EX OINT: 1.0000 | TOPICAL_OINTMENT | Freq: Two times a day (BID) | CUTANEOUS | 2 refills | Status: AC

## 2024-03-13 NOTE — Progress Notes (Signed)
 Patient presents with continued complaint of rash on the feet bilaterally rash is itchy and red and irritated and gets quite sore at times.  She has been treated with oral Lamisil  for 3 months and to start it another month.  She is also tried topical antifungals.  Has not gotten any relief.  She says actually since has been on Lamisil  for the past month infection gotten worse.  Does not have any rashes anywhere of the feet.  No history of psoriasis or other dermatologic or autoimmune disorders.   Physical exam:  General appearance: Pleasant, and in no acute distress. AOx3.  Vascular: Pedal pulses: DP 2/4 bilaterally, PT 2/4 bilaterally.  Mild edema lower legs bilaterally. Capillary fill time immediate.  Neurological: Grossly intact  Dermatologic:   Dry scaly erythematous rash with some small vesicles and fissuring plantar and dorsal lateral foot right, plantar medial heel right, plantar arch medial right, plantar lateral heel left.  Skin normal temperature bilaterally.  Skin normal color, tone, and texture bilaterally.   Musculoskeletal:      Diagnosis: 1.  Acute atopic dermatitis feet bilaterally  Plan: -Established office visit for evaluation and management. - Given lack of response with oral Lamisil  and topical antifungals, I think this might be an atopic dermatitis.  Will use topical clobetasol  ointment to see if again improvement in this.  If does not get any improvement then skin biopsies would be appropriate. - Rx clobetasol  ointment 0.05%, apply to affected area twice daily, 60 g tube, refill x 2   Return 6 weeks follow-up possible atopic dermatitis.  If no improvement on next visit consider skin biopsies

## 2024-03-23 ENCOUNTER — Encounter: Payer: Self-pay | Admitting: Advanced Practice Midwife

## 2024-04-04 ENCOUNTER — Ambulatory Visit: Admitting: Podiatry

## 2024-04-24 ENCOUNTER — Ambulatory Visit (INDEPENDENT_AMBULATORY_CARE_PROVIDER_SITE_OTHER): Admitting: Podiatry

## 2024-04-24 DIAGNOSIS — L209 Atopic dermatitis, unspecified: Secondary | ICD-10-CM | POA: Diagnosis not present

## 2024-04-24 NOTE — Progress Notes (Signed)
 Patient presents for follow-up dermatitis feet bilaterally.  Says she has not been using it regularly use it for about 2 weeks and it was improving but then she stopped using it.  And a week or 2 later started again.   Physical exam:  General appearance: Pleasant, and in no acute distress. AOx3.  Vascular: Pedal pulses: DP 2/4 bilaterally, PT 2/4 bilaterally.   Neurological: Grossly intact bilaterally  Dermatologic:   No significant improvement in the area of the red scaly erythematous rash on the plantar lateral aspect and dorsal lateral aspect of the right foot the medial aspect of the heel on the right foot and the lateral aspect of the foot on the left.  Musculoskeletal:     Diagnosis: 1.  Atopic dermatitis feet bilaterally  Plan: -Established office visit for evaluation and management level 3. - Discussed with her that these rashes can some on the foot and sometimes take 4 to 6 weeks of treatment with clobetasol  to clear.  Told her she needs to keep using it continuously for to be effective.  If it starts to clear and she stops using a lot of time there is a rebound. -Use clobetasol  ointment twice daily to affected areas.  Use continuously for the next 4 weeks.   Return 4 weeks follow-up  dermatitis.

## 2024-05-22 ENCOUNTER — Ambulatory Visit: Admitting: Podiatry

## 2024-06-21 ENCOUNTER — Encounter: Payer: Self-pay | Admitting: Internal Medicine

## 2024-06-21 ENCOUNTER — Ambulatory Visit (INDEPENDENT_AMBULATORY_CARE_PROVIDER_SITE_OTHER): Admitting: Internal Medicine

## 2024-06-21 VITALS — BP 126/78 | HR 75 | Temp 98.9°F | Ht 65.0 in | Wt 177.0 lb

## 2024-06-21 DIAGNOSIS — M79642 Pain in left hand: Secondary | ICD-10-CM | POA: Diagnosis not present

## 2024-06-21 DIAGNOSIS — L738 Other specified follicular disorders: Secondary | ICD-10-CM | POA: Insufficient documentation

## 2024-06-21 DIAGNOSIS — L719 Rosacea, unspecified: Secondary | ICD-10-CM | POA: Insufficient documentation

## 2024-06-21 DIAGNOSIS — L219 Seborrheic dermatitis, unspecified: Secondary | ICD-10-CM | POA: Insufficient documentation

## 2024-06-21 DIAGNOSIS — I10 Essential (primary) hypertension: Secondary | ICD-10-CM

## 2024-06-21 DIAGNOSIS — L500 Allergic urticaria: Secondary | ICD-10-CM | POA: Insufficient documentation

## 2024-06-21 DIAGNOSIS — R739 Hyperglycemia, unspecified: Secondary | ICD-10-CM | POA: Diagnosis not present

## 2024-06-21 MED ORDER — PREDNISONE 10 MG PO TABS: ORAL_TABLET | ORAL | 0 refills | Status: AC

## 2024-06-21 MED ORDER — TRAMADOL HCL 50 MG PO TABS: 50.0000 mg | ORAL_TABLET | Freq: Four times a day (QID) | ORAL | 0 refills | Status: AC | PRN

## 2024-06-21 NOTE — Assessment & Plan Note (Signed)
 BP Readings from Last 3 Encounters:  06/21/24 126/78  12/06/23 (!) 142/86  07/28/23 130/84   Stable, pt to continue medical treatment hygroton  25 mg qd

## 2024-06-21 NOTE — Progress Notes (Signed)
 Patient ID: Morgan Mejia, female   DOB: 1971/06/04, 53 y.o.   MRN: [id number]        Chief Complaint: follow up left hand pain, hyperglycemia, htn       HPI:  Morgan Mejia is a 53 y.o. female here with left handed, and 2 wks worsening mild now severe pain, swelling primarily to the middle and 4th finger MCPs and palmar tendons.  Does work with her hands quite a bit but not really overusing.  No trauma, fever.  No tingling or neuritic pain,  swelling is enough she cannot make a fist. No hx of gout or pseudogout known.  Pt denies chest pain, increased sob or doe, wheezing, orthopnea, PND, increased LE swelling, palpitations, dizziness or syncope.       Wt Readings from Last 3 Encounters:  06/21/24 177 lb (80.3 kg)  12/06/23 187 lb (84.8 kg)  07/28/23 181 lb 12.8 oz (82.5 kg)   BP Readings from Last 3 Encounters:  06/21/24 126/78  12/06/23 (!) 142/86  07/28/23 130/84         Past Medical History:  Diagnosis Date   Anxiety    B12 deficiency    Back pain    Chicken pox    Depression    on meds   GERD (gastroesophageal reflux disease)    hx of   Hypothyroidism    on meds   PONV (postoperative nausea and vomiting)    Swelling    Vitamin D  deficiency    Past Surgical History:  Procedure Laterality Date   Bone Spur Right 1999   WISDOM TOOTH EXTRACTION  1990    reports that she has quit smoking. She has never used smokeless tobacco. She reports current alcohol use of about 5.0 standard drinks of alcohol per week. She reports that she does not use drugs. family history includes Alcoholism in her father; Anxiety disorder in her mother; Colon polyps (age of onset: 56) in her maternal uncle; Colon polyps (age of onset: 68) in her mother; Colon polyps (age of onset: 33) in her maternal grandfather; Depression in her mother; Diabetes in her mother; Hyperlipidemia in her mother; Hypertension in her mother; Leukemia (age of onset: 48) in her father; Obesity in her mother; Pernicious  anemia in her maternal aunt and mother; Throat cancer (age of onset: 82) in her maternal grandfather; Thyroid  disease in her mother; Uterine cancer (age of onset: 87) in her mother. Allergies  Allergen Reactions   Aspirin Swelling    FACIAL SWELLING   Wellbutrin [Bupropion] Hives, Itching, Swelling, Dermatitis and Rash   Current Outpatient Medications on File Prior to Visit  Medication Sig Dispense Refill   B Complex-Folic Acid (B COMPLEX PLUS) TABS Take 1 tablet by mouth daily. 100 tablet 3   chlorthalidone  (HYGROTON ) 25 MG tablet TAKE 1 TABLET (25 MG TOTAL) BY MOUTH DAILY. 90 tablet 3   Cholecalciferol (VITAMIN D3) 50 MCG (2000 UT) capsule Take 2 capsules (4,000 Units total) by mouth daily. 100 capsule 3   clobetasol  ointment (TEMOVATE ) 0.05 % Apply 1 Application topically 2 (two) times daily. 60 g 2   clotrimazole -betamethasone  (LOTRISONE ) cream Apply 1 Application topically daily. 30 g 0   doxycycline  (VIBRA -TABS) 100 MG tablet Take 1 tablet (100 mg total) by mouth 2 (two) times daily. 28 tablet 0   levothyroxine  (SYNTHROID ) 137 MCG tablet Take 1 tablet (137 mcg total) by mouth daily before breakfast. 30 tablet 0   sulfamethoxazole -trimethoprim  (BACTRIM  DS) 800-160 MG tablet Take 1 tablet by  mouth 2 (two) times daily. 14 tablet 0   terbinafine  (LAMISIL ) 250 MG tablet Take 1 tablet (250 mg total) by mouth daily. 14 tablet 0   terbinafine  (LAMISIL ) 250 MG tablet Take 1 tablet (250 mg total) by mouth daily. 30 tablet 0   terbinafine  (LAMISIL ) 250 MG tablet Take 1 tablet (250 mg total) by mouth daily. 90 tablet 0   tirzepatide  (ZEPBOUND ) 2.5 MG/0.5ML injection vial Inject 2.5 mg into the skin once a week. 2 mL 2   TRINTELLIX  20 MG TABS tablet TAKE 1 TABLET BY MOUTH EVERY DAY 90 tablet 1   No current facility-administered medications on file prior to visit.        ROS:  All others reviewed and negative.  Objective        PE:  BP 126/78 (BP Location: Right Arm, Patient Position: Sitting,  Cuff Size: Normal)   Pulse 75   Temp 98.9 F (37.2 C) (Oral)   Ht 5' 5 (1.651 m)   Wt 177 lb (80.3 kg)   SpO2 98%   BMI 29.45 kg/m                 Constitutional: Pt appears in NAD               HENT: Head: NCAT.                Right Ear: External ear normal.                 Left Ear: External ear normal.                Eyes: . Pupils are equal, round, and reactive to light. Conjunctivae and EOM are normal               Nose: without d/c or deformity               Neck: Neck supple. Gross normal ROM               Cardiovascular: Normal rate and regular rhythm.                 Pulmonary/Chest: Effort normal and breath sounds without rales or wheezing.                Abd:  Soft, NT, ND, + BS, no organomegaly               Neurological: Pt is alert. At baseline orientation, motor grossly intact               Skin: Skin is warm. No rashes, no other new lesions, LE edema - none,  left hand - severe pain, swelling primarily to the middle and 4th finger MCPs and palmar tendons, unable to make fist o/w neurovasc intact               Psychiatric: Pt behavior is normal without agitation   Micro: none  Cardiac tracings I have personally interpreted today:  none  Pertinent Radiological findings (summarize): none   Lab Results  Component Value Date   WBC 4.6 07/28/2023   HGB 13.8 07/28/2023   HCT 40.9 07/28/2023   PLT 190.0 07/28/2023   GLUCOSE 107 (H) 07/28/2023   CHOL 190 07/28/2023   TRIG 83.0 07/28/2023   HDL 59.40 07/28/2023   LDLCALC 114 (H) 07/28/2023   ALT 17 02/17/2024   AST 24 02/17/2024   NA 139 07/28/2023   K 4.1 07/28/2023   CL  103 07/28/2023   CREATININE 0.87 07/28/2023   BUN 20 07/28/2023   CO2 30 07/28/2023   TSH 5.26 07/28/2023   HGBA1C 5.6 07/28/2023   Assessment/Plan:  Morgan Mejia is a 53 y.o. White or Caucasian [1] female with  has a past medical history of Anxiety, B12 deficiency, Back pain, Chicken pox, Depression, GERD (gastroesophageal reflux  disease), Hypothyroidism, PONV (postoperative nausea and vomiting), Swelling, and Vitamin D  deficiency.  Left hand pain Pain now severe, differential includes palmar tendonitis severe, gout or other; now for tramadol prn pain, but also prednisone taper; if pain swelling improve in the next 2-3 days I would favor gout diagnosis, but if not more significantly improved, would more likely be tendonitis, and pt encouraged to f/u with sport medicine   Hyperglycemia Lab Results  Component Value Date   HGBA1C 5.6 07/28/2023   Stable, pt to continue current medical treatment  - diet, wt control   Essential hypertension BP Readings from Last 3 Encounters:  06/21/24 126/78  12/06/23 (!) 142/86  07/28/23 130/84   Stable, pt to continue medical treatment hygroton  25 mg qd  Followup: Return if symptoms worsen or fail to improve.  Lynwood Rush, MD 06/21/2024 7:10 PM [facility]

## 2024-06-21 NOTE — Assessment & Plan Note (Signed)
 Pain now severe, differential includes palmar tendonitis severe, gout or other; now for tramadol prn pain, but also prednisone taper; if pain swelling improve in the next 2-3 days I would favor gout diagnosis, but if not more significantly improved, would more likely be tendonitis, and pt encouraged to f/u with sport medicine

## 2024-06-21 NOTE — Patient Instructions (Signed)
 You appear to have acute gout or possibly severe tendonitis of the left hand  Please take all new medication as prescribed - the prednisone, and tramadol for pain as needed  Please continue all other medications as before, and refills have been done if requested.  Please have the pharmacy call with any other refills you may need.  Please keep your appointments with your specialists as you may have planned  Please also see Sports Medicine on Monday or call for referral to [facility] if not getting better

## 2024-06-21 NOTE — Assessment & Plan Note (Signed)
 Lab Results  Component Value Date   HGBA1C 5.6 07/28/2023   Stable, pt to continue current medical treatment  - diet, wt control

## 2024-06-24 ENCOUNTER — Other Ambulatory Visit: Payer: Self-pay | Admitting: Internal Medicine
